# Patient Record
Sex: Male | Born: 1970
Health system: Southern US, Community
[De-identification: ages and names within clinical notes are randomized; demographics above are authoritative.]

## PROBLEM LIST (undated history)

## (undated) DIAGNOSIS — G4733 Obstructive sleep apnea (adult) (pediatric): Secondary | ICD-10-CM

## (undated) DIAGNOSIS — F32A Depression, unspecified: Secondary | ICD-10-CM

## (undated) DIAGNOSIS — I10 Essential (primary) hypertension: Secondary | ICD-10-CM

## (undated) DIAGNOSIS — Z9989 Dependence on other enabling machines and devices: Secondary | ICD-10-CM

## (undated) DIAGNOSIS — J45909 Unspecified asthma, uncomplicated: Secondary | ICD-10-CM

## (undated) DIAGNOSIS — M1990 Unspecified osteoarthritis, unspecified site: Secondary | ICD-10-CM

## (undated) DIAGNOSIS — E119 Type 2 diabetes mellitus without complications: Secondary | ICD-10-CM

## (undated) DIAGNOSIS — F419 Anxiety disorder, unspecified: Secondary | ICD-10-CM

## (undated) DIAGNOSIS — T7840XA Allergy, unspecified, initial encounter: Secondary | ICD-10-CM

## (undated) HISTORY — DX: Allergy, unspecified, initial encounter: T78.40XA

## (undated) HISTORY — DX: Essential (primary) hypertension: I10

## (undated) HISTORY — DX: Unspecified asthma, uncomplicated: J45.909

## (undated) HISTORY — PX: HERNIA REPAIR: SHX51

## (undated) HISTORY — DX: Type 2 diabetes mellitus without complications: E11.9

## (undated) HISTORY — PX: VASECTOMY: SHX75

---

## 1998-07-20 ENCOUNTER — Emergency Department (HOSPITAL_COMMUNITY): Admission: EM | Admit: 1998-07-20 | Discharge: 1998-07-20 | Payer: Self-pay | Admitting: Emergency Medicine

## 1998-07-26 ENCOUNTER — Encounter: Admission: RE | Admit: 1998-07-26 | Discharge: 1998-07-26 | Payer: Self-pay | Admitting: Hematology and Oncology

## 1998-08-02 ENCOUNTER — Encounter: Admission: RE | Admit: 1998-08-02 | Discharge: 1998-10-31 | Payer: Self-pay | Admitting: Internal Medicine

## 1998-09-14 ENCOUNTER — Encounter: Admission: RE | Admit: 1998-09-14 | Discharge: 1998-09-14 | Payer: Self-pay | Admitting: Internal Medicine

## 1998-09-25 ENCOUNTER — Encounter: Payer: Self-pay | Admitting: Hematology and Oncology

## 1998-09-25 ENCOUNTER — Encounter: Admission: RE | Admit: 1998-09-25 | Discharge: 1998-09-25 | Payer: Self-pay | Admitting: Hematology and Oncology

## 1998-09-25 ENCOUNTER — Ambulatory Visit (HOSPITAL_COMMUNITY): Admission: RE | Admit: 1998-09-25 | Discharge: 1998-09-25 | Payer: Self-pay | Admitting: Hematology and Oncology

## 1998-11-22 ENCOUNTER — Encounter: Admission: RE | Admit: 1998-11-22 | Discharge: 1999-02-20 | Payer: Self-pay | Admitting: Internal Medicine

## 1999-01-31 ENCOUNTER — Encounter: Admission: RE | Admit: 1999-01-31 | Discharge: 1999-01-31 | Payer: Self-pay | Admitting: Internal Medicine

## 1999-01-31 ENCOUNTER — Encounter: Payer: Self-pay | Admitting: Internal Medicine

## 1999-01-31 ENCOUNTER — Ambulatory Visit (HOSPITAL_COMMUNITY): Admission: RE | Admit: 1999-01-31 | Discharge: 1999-01-31 | Payer: Self-pay | Admitting: Internal Medicine

## 2001-12-23 ENCOUNTER — Emergency Department (HOSPITAL_COMMUNITY): Admission: EM | Admit: 2001-12-23 | Discharge: 2001-12-23 | Payer: Self-pay | Admitting: Emergency Medicine

## 2001-12-23 ENCOUNTER — Encounter: Payer: Self-pay | Admitting: Emergency Medicine

## 2003-08-16 ENCOUNTER — Emergency Department (HOSPITAL_COMMUNITY): Admission: EM | Admit: 2003-08-16 | Discharge: 2003-08-16 | Payer: Self-pay | Admitting: Emergency Medicine

## 2004-08-21 ENCOUNTER — Emergency Department (HOSPITAL_COMMUNITY): Admission: EM | Admit: 2004-08-21 | Discharge: 2004-08-21 | Payer: Self-pay | Admitting: Emergency Medicine

## 2011-02-12 ENCOUNTER — Ambulatory Visit (INDEPENDENT_AMBULATORY_CARE_PROVIDER_SITE_OTHER): Payer: 59 | Admitting: Family Medicine

## 2011-02-12 VITALS — BP 135/84 | HR 86 | Temp 98.6°F | Resp 16 | Ht 76.0 in | Wt 267.0 lb

## 2011-02-12 DIAGNOSIS — I10 Essential (primary) hypertension: Secondary | ICD-10-CM | POA: Insufficient documentation

## 2011-02-12 MED ORDER — LISINOPRIL-HYDROCHLOROTHIAZIDE 20-25 MG PO TABS: 1.0000 | ORAL_TABLET | Freq: Every day | ORAL | Status: DC

## 2011-02-12 NOTE — Progress Notes (Signed)
  Subjective:    Patient ID: George Patton, male    DOB: Jun 04, 1970, 41 y.o.   MRN: [id number]  HPI 41 yo male with history of htn here for med refills.  Takes lisinopril-hctz 20-25.  Last labs one year ago, normal.  Ran out yesterday.   Doing well on it.  No side effects.   No other complaints.    Review of Systems Negative except as per HPI     Objective:   Physical Exam  Constitutional: He appears well-developed and well-nourished.  Cardiovascular: Normal rate, regular rhythm, normal heart sounds and intact distal pulses.   Pulmonary/Chest: Effort normal and breath sounds normal.          Assessment & Plan:  HTN - refilled med 90days, 1 refill.  Check bmet. Plans to return for physical in 4-6 weeks.

## 2011-02-13 LAB — BASIC METABOLIC PANEL
BUN: 17 mg/dL (ref 6–23)
CO2: 29 mEq/L (ref 19–32)
Calcium: 9.8 mg/dL (ref 8.4–10.5)
Chloride: 103 mEq/L (ref 96–112)
Creat: 1.14 mg/dL (ref 0.50–1.35)
Glucose, Bld: 122 mg/dL — ABNORMAL HIGH (ref 70–99)
Potassium: 3.8 mEq/L (ref 3.5–5.3)
Sodium: 140 mEq/L (ref 135–145)

## 2011-08-13 ENCOUNTER — Other Ambulatory Visit: Payer: Self-pay | Admitting: Family Medicine

## 2011-08-14 ENCOUNTER — Other Ambulatory Visit: Payer: Self-pay | Admitting: Family Medicine

## 2011-11-12 ENCOUNTER — Other Ambulatory Visit: Payer: Self-pay | Admitting: Physician Assistant

## 2011-12-17 ENCOUNTER — Ambulatory Visit (INDEPENDENT_AMBULATORY_CARE_PROVIDER_SITE_OTHER): Payer: 59 | Admitting: Family Medicine

## 2011-12-17 VITALS — BP 144/96 | HR 90 | Temp 98.2°F | Resp 18 | Ht 77.0 in | Wt 370.8 lb

## 2011-12-17 DIAGNOSIS — Z125 Encounter for screening for malignant neoplasm of prostate: Secondary | ICD-10-CM

## 2011-12-17 DIAGNOSIS — E663 Overweight: Secondary | ICD-10-CM

## 2011-12-17 DIAGNOSIS — I10 Essential (primary) hypertension: Secondary | ICD-10-CM

## 2011-12-17 DIAGNOSIS — Z23 Encounter for immunization: Secondary | ICD-10-CM

## 2011-12-17 LAB — POCT CBC
Granulocyte percent: 64.9 %G (ref 37–80)
HCT, POC: 49.9 % (ref 43.5–53.7)
Hemoglobin: 15.5 g/dL (ref 14.1–18.1)
Lymph, poc: 2 (ref 0.6–3.4)
MCH, POC: 28.2 pg (ref 27–31.2)
MCHC: 31.1 g/dL — AB (ref 31.8–35.4)
MCV: 90.7 fL (ref 80–97)
MID (cbc): 0.7 (ref 0–0.9)
MPV: 11.5 fL (ref 0–99.8)
POC Granulocyte: 5 (ref 2–6.9)
POC LYMPH PERCENT: 26.2 %L (ref 10–50)
POC MID %: 8.9 %M (ref 0–12)
Platelet Count, POC: 254 10*3/uL (ref 142–424)
RBC: 5.5 M/uL (ref 4.69–6.13)
RDW, POC: 15 %
WBC: 7.7 10*3/uL (ref 4.6–10.2)

## 2011-12-17 LAB — COMPREHENSIVE METABOLIC PANEL
ALT: 20 U/L (ref 0–53)
AST: 16 U/L (ref 0–37)
Albumin: 4.5 g/dL (ref 3.5–5.2)
Alkaline Phosphatase: 75 U/L (ref 39–117)
BUN: 13 mg/dL (ref 6–23)
CO2: 27 mEq/L (ref 19–32)
Calcium: 9.5 mg/dL (ref 8.4–10.5)
Chloride: 102 mEq/L (ref 96–112)
Creat: 1.1 mg/dL (ref 0.50–1.35)
Glucose, Bld: 105 mg/dL — ABNORMAL HIGH (ref 70–99)
Potassium: 4.3 mEq/L (ref 3.5–5.3)
Sodium: 141 mEq/L (ref 135–145)
Total Bilirubin: 0.5 mg/dL (ref 0.3–1.2)
Total Protein: 7.1 g/dL (ref 6.0–8.3)

## 2011-12-17 LAB — PSA: PSA: 0.37 ng/mL (ref <=4.00)

## 2011-12-17 LAB — LIPID PANEL
Cholesterol: 165 mg/dL (ref 0–200)
HDL: 40 mg/dL (ref >39)
LDL Cholesterol: 108 mg/dL — ABNORMAL HIGH (ref 0–99)
Total CHOL/HDL Ratio: 4.1 Ratio
Triglycerides: 85 mg/dL (ref <150)
VLDL: 17 mg/dL (ref 0–40)

## 2011-12-17 MED ORDER — LISINOPRIL-HYDROCHLOROTHIAZIDE 20-25 MG PO TABS: 1.0000 | ORAL_TABLET | Freq: Every day | ORAL | Status: DC

## 2011-12-17 NOTE — Patient Instructions (Addendum)
Keep an eye on your BP and have a wonderful holiday.  I will be in touch with your labs and please come and see me for a physical when you can

## 2011-12-17 NOTE — Progress Notes (Signed)
[facility] [address], [place] [address] [phone]  Date:  12/17/2011   Name:  George Patton   DOB:  [birth year]   MRN:  [id number]  PCP:  Abbe Amsterdam, MD    Chief Complaint: Hypertension   History of Present Illness:  George Patton is a 41 y.o. very pleasant male patient who presents with the following:  Here to recheck meds and labs.  Last visit with [place] in February of this year.  He had a BMP at that time as well.  He has been taking his medication, but did not take it yet today.  He usually takes in in the morning.   When he self- checks his BP it will be 135/ 88 or thereabouts.    He is fating today and would like to do labs as well, he also needs his flu shot.   He has 3 children ages 39, 56 and 31.    Patient Active Problem List  Diagnosis  . Hypertension    Past Medical History  Diagnosis Date  . Allergy   . Asthma   . Hypertension     Past Surgical History  Procedure Date  . Vasectomy   . Hernia repair     History  Substance Use Topics  . Smoking status: Current Some Day Smoker -- 0.2 packs/day    Types: Cigarettes  . Smokeless tobacco: Not on file  . Alcohol Use: 0.6 oz/week    1 Shots of liquor per week    Family History  Problem Relation Age of Onset  . Asthma Mother   . Migraines Sister   . Asthma Son     No Known Allergies  Medication list has been reviewed and updated.  Current Outpatient Prescriptions on File Prior to Visit  Medication Sig Dispense Refill  . lisinopril-hydrochlorothiazide (PRINZIDE,ZESTORETIC) 20-25 MG per tablet Take 1 tablet by mouth daily. Need office visit for additional refills.  30 tablet  0    Review of Systems:  As per HPI- otherwise negative.   Physical Examination: Filed Vitals:   12/17/11 0850  BP: 144/96  Pulse: 90  Temp: 98.2 F (36.8 C)  Resp: 18   Filed Vitals:   12/17/11 0850  Height: 6\' 5"  (1.956 m)  Weight: 370 lb 12.8 oz (168.194 kg)   Body mass index  is 43.97 kg/(m^2). Ideal Body Weight: Weight in (lb) to have BMI = 25: 210.4   GEN: WDWN, NAD, Non-toxic, A & O x 3, obese and tall build HEENT: Atraumatic, Normocephalic. Neck supple. No masses, No LAD. Ears and Nose: No external deformity. CV: RRR, No M/G/R. No JVD. No thrill. No extra heart sounds. PULM: CTA B, no wheezes, crackles, rhonchi. No retractions. No resp. distress. No accessory muscle use. ABD: S, NT, ND, No rebound. No HSM. EXTR: No c/c/e NEURO Normal gait.  PSYCH: Normally interactive. Conversant. Not depressed or anxious appearing.  Calm demeanor.    Assessment and Plan: 1. HTN (hypertension)  lisinopril-hydrochlorothiazide (PRINZIDE,ZESTORETIC) 20-25 MG per tablet, POCT CBC, Comprehensive metabolic panel, Lipid panel  2. Overweight  Lipid panel  3. Screening for prostate cancer  PSA   BP is a bit up today but he did not take his medication this am.  Bastien would like to have a PSA today as we are drawing blood, no family history of prostate cancer See patient instructions for more details.     Abbe Amsterdam, MD

## 2011-12-18 ENCOUNTER — Encounter: Payer: Self-pay | Admitting: Family Medicine

## 2012-11-11 ENCOUNTER — Other Ambulatory Visit: Payer: Self-pay

## 2012-12-20 ENCOUNTER — Telehealth: Payer: Self-pay

## 2012-12-20 DIAGNOSIS — I10 Essential (primary) hypertension: Secondary | ICD-10-CM

## 2012-12-20 MED ORDER — LISINOPRIL-HYDROCHLOROTHIAZIDE 20-25 MG PO TABS: 1.0000 | ORAL_TABLET | Freq: Every day | ORAL | Status: DC

## 2012-12-20 NOTE — Telephone Encounter (Signed)
Sent in

## 2012-12-20 NOTE — Telephone Encounter (Signed)
Pt is scheduled to see Dr Patsy Lager for a CPE on 01/25/12 @ 9:00, but will be out of BP meds in 4days. Pt is requesting a refill, until he can see her in January Pt uses [facility] on [address]

## 2013-01-24 ENCOUNTER — Other Ambulatory Visit: Payer: Self-pay | Admitting: Family Medicine

## 2013-01-24 ENCOUNTER — Ambulatory Visit (INDEPENDENT_AMBULATORY_CARE_PROVIDER_SITE_OTHER): Payer: 59 | Admitting: Family Medicine

## 2013-01-24 ENCOUNTER — Encounter: Payer: Self-pay | Admitting: Family Medicine

## 2013-01-24 VITALS — BP 142/85 | HR 82 | Temp 98.5°F | Resp 16 | Ht 76.0 in | Wt 368.6 lb

## 2013-01-24 DIAGNOSIS — Z23 Encounter for immunization: Secondary | ICD-10-CM

## 2013-01-24 DIAGNOSIS — E669 Obesity, unspecified: Secondary | ICD-10-CM | POA: Insufficient documentation

## 2013-01-24 DIAGNOSIS — E119 Type 2 diabetes mellitus without complications: Secondary | ICD-10-CM

## 2013-01-24 DIAGNOSIS — I10 Essential (primary) hypertension: Secondary | ICD-10-CM

## 2013-01-24 DIAGNOSIS — Z0000 Encounter for general adult medical examination without abnormal findings: Secondary | ICD-10-CM

## 2013-01-24 DIAGNOSIS — G4733 Obstructive sleep apnea (adult) (pediatric): Secondary | ICD-10-CM | POA: Insufficient documentation

## 2013-01-24 DIAGNOSIS — R739 Hyperglycemia, unspecified: Secondary | ICD-10-CM

## 2013-01-24 LAB — CBC
HCT: 45.6 % (ref 39.0–52.0)
Hemoglobin: 15.8 g/dL (ref 13.0–17.0)
MCH: 28.6 pg (ref 26.0–34.0)
MCHC: 34.6 g/dL (ref 30.0–36.0)
MCV: 82.5 fL (ref 78.0–100.0)
Platelets: 266 10*3/uL (ref 150–400)
RBC: 5.53 MIL/uL (ref 4.22–5.81)
RDW: 14.3 % (ref 11.5–15.5)
WBC: 7.7 10*3/uL (ref 4.0–10.5)

## 2013-01-24 LAB — COMPREHENSIVE METABOLIC PANEL
ALT: 26 U/L (ref 0–53)
AST: 20 U/L (ref 0–37)
Albumin: 4.6 g/dL (ref 3.5–5.2)
Alkaline Phosphatase: 76 U/L (ref 39–117)
BUN: 15 mg/dL (ref 6–23)
CO2: 28 mEq/L (ref 19–32)
Calcium: 9.6 mg/dL (ref 8.4–10.5)
Chloride: 99 mEq/L (ref 96–112)
Creat: 1.07 mg/dL (ref 0.50–1.35)
Glucose, Bld: 124 mg/dL — ABNORMAL HIGH (ref 70–99)
Potassium: 3.8 mEq/L (ref 3.5–5.3)
Sodium: 138 mEq/L (ref 135–145)
Total Bilirubin: 0.5 mg/dL (ref 0.3–1.2)
Total Protein: 7.4 g/dL (ref 6.0–8.3)

## 2013-01-24 LAB — LIPID PANEL
Cholesterol: 170 mg/dL (ref 0–200)
HDL: 42 mg/dL (ref >39)
LDL Cholesterol: 108 mg/dL — ABNORMAL HIGH (ref 0–99)
Total CHOL/HDL Ratio: 4 Ratio
Triglycerides: 101 mg/dL (ref <150)
VLDL: 20 mg/dL (ref 0–40)

## 2013-01-24 LAB — PSA: PSA: 0.47 ng/mL (ref <=4.00)

## 2013-01-24 MED ORDER — LISINOPRIL-HYDROCHLOROTHIAZIDE 20-25 MG PO TABS
1.0000 | ORAL_TABLET | Freq: Every day | ORAL | Status: DC
Start: 2013-01-24 — End: 2014-03-13

## 2013-01-24 MED ORDER — LISINOPRIL 20 MG PO TABS: ORAL_TABLET | ORAL | Status: DC

## 2013-01-24 NOTE — Progress Notes (Addendum)
[facility] [address] [place] [address] [phone]  Date:  01/24/2013   Name:  George Patton   DOB:  May 16, 1970   MRN:  [id number]  PCP:  Lamar Blinks, MD    Chief Complaint: Annual Exam   History of Present Illness:  George Patton is a 43 y.o. very pleasant male patient who presents with the following:  He is here today for a CPE and follow-up HTN.  He is taking lisinopril/ hctz for his BP.   He is fasting today.  He does also have OSA and admits he does not like using his CPAP machine.  He has not used it "at all" for about 2 years. He would like to have a repeat evaluation   He is working in the [employer] system at [employer] now. He is enjoying this job.  However he does not get as much exercise as he would like because he is in the office a good bit.    He does notice some heartburn after eating  Patient Active Problem List   Diagnosis Date Noted  . Hypertension 02/12/2011    Past Medical History  Diagnosis Date  . Allergy   . Asthma   . Hypertension     Past Surgical History  Procedure Laterality Date  . Vasectomy    . Hernia repair      History  Substance Use Topics  . Smoking status: Current Some Day Smoker -- 0.25 packs/day    Types: Cigarettes  . Smokeless tobacco: Not on file  . Alcohol Use: 0.6 oz/week    1 Shots of liquor per week    Family History  Problem Relation Age of Onset  . Asthma Mother   . Migraines Sister   . Asthma Son     No Known Allergies  Medication list has been reviewed and updated.  Current Outpatient Prescriptions on File Prior to Visit  Medication Sig Dispense Refill  . lisinopril-hydrochlorothiazide (PRINZIDE,ZESTORETIC) 20-25 MG per tablet Take 1 tablet by mouth daily. Need office visit for additional refills.  30 tablet  0   No current facility-administered medications on file prior to visit.    Review of Systems:  As per HPI- otherwise negative. Wt Readings from Last 3 Encounters:   01/24/13 368 lb 9.6 oz (167.196 kg)  12/17/11 370 lb 12.8 oz (168.194 kg)  02/12/11 267 lb (121.11 kg)     Physical Examination: Filed Vitals:   01/24/13 0911  BP: 148/88  Pulse: 82  Temp: 98.5 F (36.9 C)  Resp: 16   Filed Vitals:   01/24/13 0911  Height: 6\' 4"  (1.93 m)  Weight: 368 lb 9.6 oz (167.196 kg)   Body mass index is 44.89 kg/(m^2). Ideal Body Weight: Weight in (lb) to have BMI = 25: 205  GEN: WDWN, NAD, Non-toxic, A & O x 3, looks well, obese HEENT: Atraumatic, Normocephalic. Neck supple. No masses, No LAD. Ears and Nose: No external deformity. CV: RRR, No M/G/R. No JVD. No thrill. No extra heart sounds. PULM: CTA B, no wheezes, crackles, rhonchi. No retractions. No resp. distress. No accessory muscle use. ABD: S, NT, ND. No rebound. No HSM. EXTR: No c/c/e NEURO Normal gait.  PSYCH: Normally interactive. Conversant. Not depressed or anxious appearing.  Calm demeanor.  GU: normal testicles and penis, no inguinal hernia Normal rectal exam, no prostate masses  Assessment and Plan: Physical exam - Plan: Comprehensive metabolic panel, Lipid panel, PSA, Flu vaccine greater than  or equal to 3yo preservative free IM, CBC  HTN (hypertension) - Plan: lisinopril-hydrochlorothiazide (PRINZIDE,ZESTORETIC) 20-25 MG per tablet, lisinopril (PRINIVIL,ZESTRIL) 20 MG tablet  OSA (obstructive sleep apnea) - Plan: Ambulatory referral to Neurology  Obesity, unspecified  HTN: we are going to add 10mg  of lisinopril at night to his current regimen.  He will give me a call with an update in the next few weeks.   Referral for a repeat sleep evaluation Encouraged weight loss and smoking cessation   Signed Lamar Blinks, MD  01/25/13: called with update. I am afraid that his labs do show DM with an A1c of 6.9%.  He is not really surprised to hear this news, but would like to try and lose weight so he will not need to be on medication permanently. For now will start metformin and  lovastatin.  Have asked him to make an appt to follow-up in 2 or 3 weeks so we can discuss this face to face.  He will look at the ADA website for more good info about diet and exercise.

## 2013-01-24 NOTE — Patient Instructions (Signed)
Please continue to work on quitting smoking.    Also, working on weight loss will help with your blood pressure.    Add the 2nd dose of lisinopril in the evening- start with a 1/2 of the 20 mg tablet for 10 mg.  Keep an eye on your blood pressure and send me an email in about one month with an update.

## 2013-01-25 LAB — HEMOGLOBIN A1C
Hgb A1c MFr Bld: 6.9 % — ABNORMAL HIGH (ref <5.7)
Mean Plasma Glucose: 151 mg/dL — ABNORMAL HIGH (ref <117)

## 2013-01-25 MED ORDER — METFORMIN HCL 500 MG PO TABS: ORAL_TABLET | ORAL | Status: DC

## 2013-01-25 MED ORDER — LOVASTATIN 20 MG PO TABS: 20.0000 mg | ORAL_TABLET | Freq: Every day | ORAL | Status: DC

## 2013-01-25 NOTE — Addendum Note (Signed)
Addended by: Lamar Blinks C on: 01/25/2013 08:54 AM   Modules accepted: Orders

## 2013-02-01 ENCOUNTER — Telehealth: Payer: Self-pay

## 2013-02-01 NOTE — Telephone Encounter (Signed)
George Patton from [facility] needs further information about this patient referral for cpap therapy does it need psg or split and needs the referral signed by our physician  Best number is [phone]

## 2013-02-14 ENCOUNTER — Encounter: Payer: Self-pay | Admitting: Family Medicine

## 2013-02-14 ENCOUNTER — Ambulatory Visit (INDEPENDENT_AMBULATORY_CARE_PROVIDER_SITE_OTHER): Payer: 59 | Admitting: Family Medicine

## 2013-02-14 VITALS — BP 140/90 | HR 74 | Temp 98.5°F | Resp 18 | Ht 76.0 in | Wt 368.8 lb

## 2013-02-14 DIAGNOSIS — E119 Type 2 diabetes mellitus without complications: Secondary | ICD-10-CM

## 2013-02-14 DIAGNOSIS — I10 Essential (primary) hypertension: Secondary | ICD-10-CM

## 2013-02-14 DIAGNOSIS — Z23 Encounter for immunization: Secondary | ICD-10-CM

## 2013-02-14 NOTE — Progress Notes (Signed)
[facility] [address], [place] [address] [phone]  Date:  02/14/2013   Name:  George Patton   DOB:  1970/12/18   MRN:  [id number]  PCP:  Lamar Blinks, MD    Chief Complaint: Follow-up   History of Present Illness:  George Patton is a 43 y.o. very pleasant male patient who presents with the following:  He was diagnosed with DM at his recent CPE on 1/19- his A1c was 6.9%. We have started metformin and he is taking 500 mg a day at this point.   We did add another 1/2 of lisinopril in the evening.  He has not been checking his BP at home much yet.    He is "working on" his diet.  He has made some good changes and is trying to exercise more.    He is not checking his glucose yet but would like to learn how.    Wt Readings from Last 3 Encounters:  02/14/13 368 lb 12.8 oz (167.287 kg)  01/24/13 368 lb 9.6 oz (167.196 kg)  12/17/11 370 lb 12.8 oz (168.194 kg)     Patient Active Problem List   Diagnosis Date Noted  . OSA (obstructive sleep apnea) 01/24/2013  . Obesity, unspecified 01/24/2013  . Hypertension 02/12/2011    Past Medical History  Diagnosis Date  . Allergy   . Asthma   . Hypertension     Past Surgical History  Procedure Laterality Date  . Vasectomy    . Hernia repair      History  Substance Use Topics  . Smoking status: Current Some Day Smoker -- 0.25 packs/day    Types: Cigarettes  . Smokeless tobacco: Not on file  . Alcohol Use: 0.6 oz/week    1 Shots of liquor per week    Family History  Problem Relation Age of Onset  . Asthma Mother   . Migraines Sister   . Asthma Son     No Known Allergies  Medication list has been reviewed and updated.  Current Outpatient Prescriptions on File Prior to Visit  Medication Sig Dispense Refill  . lisinopril (PRINIVIL,ZESTRIL) 20 MG tablet Start with a 1/2 tablet in the evening.  Adjust as directed  90 tablet  3  . lisinopril-hydrochlorothiazide (PRINZIDE,ZESTORETIC) 20-25 MG  per tablet Take 1 tablet by mouth daily. Need office visit for additional refills.  90 tablet  3  . lovastatin (MEVACOR) 20 MG tablet Take 1 tablet (20 mg total) by mouth at bedtime.  90 tablet  3  . metFORMIN (GLUCOPHAGE) 500 MG tablet Take one pill at bedtime.  Increase to twice a day as directed  180 tablet  3   No current facility-administered medications on file prior to visit.    Review of Systems:  As per HPI- otherwise negative.   Physical Examination: Filed Vitals:   02/14/13 0921  BP: 160/94  Pulse: 74  Temp: 98.5 F (36.9 C)  Resp: 18   Filed Vitals:   02/14/13 0921  Height: 6\' 4"  (1.93 m)  Weight: 368 lb 12.8 oz (167.287 kg)   Body mass index is 44.91 kg/(m^2). Ideal Body Weight: Weight in (lb) to have BMI = 25: 205  GEN: WDWN, NAD, Non-toxic, A & O x 3, obese, looks well HEENT: Atraumatic, Normocephalic. Neck supple. No masses, No LAD.   Ears and Nose: No external deformity. CV: RRR, No M/G/R. No JVD. No thrill. No extra heart sounds. PULM: CTA B, no wheezes,  crackles, rhonchi. No retractions. No resp. distress. No accessory muscle use. ABD: S, NT, ND, +BS. No rebound. No HSM. EXTR: No c/c/e NEURO Normal gait.  PSYCH: Normally interactive. Conversant. Not depressed or anxious appearing.  Calm demeanor.  Performed foot exam today.  Normal perfusion, sensation and monofilament testing  Pt shown how to check his glucose on our meter today Assessment and Plan: Type II or unspecified type diabetes mellitus without mention of complication, not stated as uncontrolled - Plan: Microalbumin, urine, Pneumococcal polysaccharide vaccine 23-valent greater than or equal to 2yo subcutaneous/IM, HM DIABETES FOOT EXAM  HTN (hypertension)  Discussed diabetes, gave rx for meter and supplies.  Encouraged him to test a couple of times a week if he would like, or if not feeling well Pneumovax vaccine, microalbuin testing BP is controlled, continue medication Will plan further  follow- up pending labs. Plan recheck in about 3- 4 months for his DM   Signed Lamar Blinks, MD

## 2013-02-14 NOTE — Patient Instructions (Signed)
As a diabetic, there are several things you can do to monitor your condition and maintain your health.  1. Check your feet daily for any skin breakdown 2. Exercise and keep track of your diet 3. Let us know before you run out of your medications 4. Get your annual flu shot, and ask if you need a pneumonia shot 5. Ask if you are up to date on your labs; you should have an A1c every 6 months, a urine protein test annually, and a cholesterol test annually.  Your doctor may decide to do labs more often if indicated 6. Take off your shoes and socks at each visit.  Be sure your doctor examines your feet.   7. Ask about your blood pressure.  Your goal is 130/ 80 or less 8. Get an annual eye exam.  Please ask your ophthalmologist to send [place] your report 9. Keep up with your dental cleanings and exams.

## 2013-02-15 LAB — MICROALBUMIN, URINE: Microalb, Ur: 0.67 mg/dL (ref 0.00–1.89)

## 2013-05-16 ENCOUNTER — Ambulatory Visit (INDEPENDENT_AMBULATORY_CARE_PROVIDER_SITE_OTHER): Payer: 59 | Admitting: Family Medicine

## 2013-05-16 ENCOUNTER — Encounter: Payer: Self-pay | Admitting: Family Medicine

## 2013-05-16 VITALS — BP 140/90 | HR 82 | Temp 98.0°F | Resp 16 | Ht 76.0 in | Wt 373.4 lb

## 2013-05-16 DIAGNOSIS — E785 Hyperlipidemia, unspecified: Secondary | ICD-10-CM

## 2013-05-16 DIAGNOSIS — E119 Type 2 diabetes mellitus without complications: Secondary | ICD-10-CM

## 2013-05-16 DIAGNOSIS — I10 Essential (primary) hypertension: Secondary | ICD-10-CM

## 2013-05-16 LAB — LIPID PANEL
Cholesterol: 173 mg/dL (ref 0–200)
HDL: 39 mg/dL — ABNORMAL LOW (ref >39)
LDL Cholesterol: 108 mg/dL — ABNORMAL HIGH (ref 0–99)
Total CHOL/HDL Ratio: 4.4 Ratio
Triglycerides: 131 mg/dL (ref <150)
VLDL: 26 mg/dL (ref 0–40)

## 2013-05-16 LAB — BASIC METABOLIC PANEL
BUN: 14 mg/dL (ref 6–23)
CO2: 27 mEq/L (ref 19–32)
Calcium: 9.3 mg/dL (ref 8.4–10.5)
Chloride: 103 mEq/L (ref 96–112)
Creat: 1.14 mg/dL (ref 0.50–1.35)
Glucose, Bld: 119 mg/dL — ABNORMAL HIGH (ref 70–99)
Potassium: 3.7 mEq/L (ref 3.5–5.3)
Sodium: 138 mEq/L (ref 135–145)

## 2013-05-16 LAB — HEMOGLOBIN A1C
Hgb A1c MFr Bld: 6.9 % — ABNORMAL HIGH (ref <5.7)
Mean Plasma Glucose: 151 mg/dL — ABNORMAL HIGH (ref <117)

## 2013-05-16 NOTE — Progress Notes (Signed)
[facility] [address], [place] [address] [phone]  Date:  05/16/2013   Name:  George Patton   DOB:  [birth year]   MRN:  [id number]  PCP:  Lamar Blinks, MD    Chief Complaint: Hypertension and Diabetes   History of Present Illness:  George Patton is a 43 y.o. very pleasant male patient who presents with the following:  Here today to recheck BP and also discuss recently dx diabetes.  He was dx with an A1c of 6.9 in January. He is taking lisinopril and metformin, as well as lovastatin.    Wt Readings from Last 3 Encounters:  05/16/13 373 lb 6.4 oz (169.373 kg)  02/14/13 368 lb 12.8 oz (167.287 kg)  01/24/13 368 lb 9.6 oz (167.196 kg)   He is doing well, he has not noted any SE.  He just got a glucose meter, and plan to start checking his glucose He is on metformin 500 once a day.    He also has not been checking his BP at home.   He is trying to exericse some, but "I know I ned to do more.  He works at [employer]  He is fasting today for labs  Patient Active Problem List   Diagnosis Date Noted  . OSA (obstructive sleep apnea) 01/24/2013  . Obesity, unspecified 01/24/2013  . Hypertension 02/12/2011    Past Medical History  Diagnosis Date  . Allergy   . Asthma   . Hypertension     Past Surgical History  Procedure Laterality Date  . Vasectomy    . Hernia repair      History  Substance Use Topics  . Smoking status: Current Some Day Smoker -- 0.25 packs/day    Types: Cigarettes  . Smokeless tobacco: Not on file  . Alcohol Use: 0.6 oz/week    1 Shots of liquor per week    Family History  Problem Relation Age of Onset  . Asthma Mother   . Migraines Sister   . Asthma Son     No Known Allergies  Medication list has been reviewed and updated.  Current Outpatient Prescriptions on File Prior to Visit  Medication Sig Dispense Refill  . lisinopril (PRINIVIL,ZESTRIL) 20 MG tablet Start with a 1/2 tablet in the evening.  Adjust  as directed  90 tablet  3  . lisinopril-hydrochlorothiazide (PRINZIDE,ZESTORETIC) 20-25 MG per tablet Take 1 tablet by mouth daily. Need office visit for additional refills.  90 tablet  3  . lovastatin (MEVACOR) 20 MG tablet Take 1 tablet (20 mg total) by mouth at bedtime.  90 tablet  3  . metFORMIN (GLUCOPHAGE) 500 MG tablet Take one pill at bedtime.  Increase to twice a day as directed  180 tablet  3   No current facility-administered medications on file prior to visit.    Review of Systems:  As per HPI- otherwise negative.   Physical Examination: Filed Vitals:   05/16/13 0930  BP: 150/86  Pulse: 82  Temp: 98 F (36.7 C)  Resp: 16   Filed Vitals:   05/16/13 0930  Height: 6\' 4"  (1.93 m)  Weight: 373 lb 6.4 oz (169.373 kg)   Body mass index is 45.47 kg/(m^2). Ideal Body Weight: Weight in (lb) to have BMI = 25: 205  GEN: WDWN, NAD, Non-toxic, A & O x 3, obese, looks well HEENT: Atraumatic, Normocephalic. Neck supple. No masses, No LAD. Ears and Nose: No external deformity. CV: RRR, No  M/G/R. No JVD. No thrill. No extra heart sounds. PULM: CTA B, no wheezes, crackles, rhonchi. No retractions. No resp. distress. No accessory muscle use. ABD: S, NT, ND. EXTR: No c/c/e NEURO Normal gait.  PSYCH: Normally interactive. Conversant. Not depressed or anxious appearing.  Calm demeanor.    Assessment and Plan: Type II or unspecified type diabetes mellitus without mention of complication, not stated as uncontrolled - Plan: HM Diabetes Foot Exam, Basic metabolic panel, Lipid panel, Hemoglobin A1c, CANCELED: POCT glycosylated hemoglobin (Hb A1C)  Dyslipidemia - Plan: Lipid panel  HTN (hypertension)  Morbid obesity  Check labs as above today.  unfortunately he has gained a few lbs.  Again discussed the importance of diet, exercise and weight loss.  Will plan to recheck in about 3 months.  Continue current regimen.  Encouraged him to get an eye exam asap Signed Lamar Blinks,  MD  Results for orders placed in visit on 42/70/62  BASIC METABOLIC PANEL      Result Value Ref Range   Sodium 138  135 - 145 mEq/L   Potassium 3.7  3.5 - 5.3 mEq/L   Chloride 103  96 - 112 mEq/L   CO2 27  19 - 32 mEq/L   Glucose, Bld 119 (*) 70 - 99 mg/dL   BUN 14  6 - 23 mg/dL   Creat 1.14  0.50 - 1.35 mg/dL   Calcium 9.3  8.4 - 10.5 mg/dL  LIPID PANEL      Result Value Ref Range   Cholesterol 173  0 - 200 mg/dL   Triglycerides 131  <150 mg/dL   HDL 39 (*) >39 mg/dL   Total CHOL/HDL Ratio 4.4     VLDL 26  0 - 40 mg/dL   LDL Cholesterol 108 (*) 0 - 99 mg/dL  HEMOGLOBIN A1C      Result Value Ref Range   Hemoglobin A1C 6.9 (*) <5.7 %   Mean Plasma Glucose 151 (*) <117 mg/dL

## 2013-05-16 NOTE — Patient Instructions (Signed)
Please do work on your diet and exercise- we need to get some pounds off.  I will be in touch with your a1c and other labs when they come in Always great to see you, take care and let's plan to recheck in August.

## 2013-08-15 ENCOUNTER — Ambulatory Visit: Payer: 59 | Admitting: Family Medicine

## 2013-08-29 ENCOUNTER — Encounter: Payer: Self-pay | Admitting: Family Medicine

## 2013-08-29 ENCOUNTER — Ambulatory Visit (INDEPENDENT_AMBULATORY_CARE_PROVIDER_SITE_OTHER): Payer: 59 | Admitting: Family Medicine

## 2013-08-29 VITALS — BP 157/95 | HR 84 | Temp 98.0°F | Resp 18 | Ht 77.0 in | Wt 369.0 lb

## 2013-08-29 DIAGNOSIS — E119 Type 2 diabetes mellitus without complications: Secondary | ICD-10-CM

## 2013-08-29 DIAGNOSIS — I10 Essential (primary) hypertension: Secondary | ICD-10-CM

## 2013-08-29 LAB — COMPREHENSIVE METABOLIC PANEL
ALT: 31 U/L (ref 0–53)
AST: 21 U/L (ref 0–37)
Albumin: 4.6 g/dL (ref 3.5–5.2)
Alkaline Phosphatase: 80 U/L (ref 39–117)
BUN: 15 mg/dL (ref 6–23)
CO2: 28 mEq/L (ref 19–32)
Calcium: 9.7 mg/dL (ref 8.4–10.5)
Chloride: 101 mEq/L (ref 96–112)
Creat: 1.14 mg/dL (ref 0.50–1.35)
Glucose, Bld: 122 mg/dL — ABNORMAL HIGH (ref 70–99)
Potassium: 4 mEq/L (ref 3.5–5.3)
Sodium: 137 mEq/L (ref 135–145)
Total Bilirubin: 0.6 mg/dL (ref 0.2–1.2)
Total Protein: 7.3 g/dL (ref 6.0–8.3)

## 2013-08-29 LAB — HEMOGLOBIN A1C
Hgb A1c MFr Bld: 7 % — ABNORMAL HIGH (ref <5.7)
Mean Plasma Glucose: 154 mg/dL — ABNORMAL HIGH (ref <117)

## 2013-08-29 NOTE — Progress Notes (Signed)
[facility] [address] [place] [address] [phone]  Date:  08/29/2013   Name:  ITZAEL LIPTAK   DOB:  [birth year]   MRN:  [id number]  PCP:  Lamar Blinks, MD    Chief Complaint: Follow-up   History of Present Illness:  IGNACE MANDIGO is a 43 y.o. very pleasant male patient who presents with the following:  Here today to follow-up on his DM.  He is doing well and has no particular concerns.   He is taking metformin 500 mg once a day and doing well with this.   He is taking lisinopril 20/25 in the am and another 10mg  of lisinopril only at night.   He is not really checking his BP.  His glucose is running approx 110 or less in the am.    He does have an eye doctor and is UTD.   He would like to do a flu shot today  He stopped smoking about 2 months now.  This is going well - he was never a heavy smoker.  "I don't miss it at all."    Wt Readings from Last 3 Encounters:  08/29/13 369 lb (167.377 kg)  05/16/13 373 lb 6.4 oz (169.373 kg)  02/14/13 368 lb 12.8 oz (167.287 kg)    Lab Results  Component Value Date   HGBA1C 6.9* 05/16/2013     Patient Active Problem List   Diagnosis Date Noted  . Type II or unspecified type diabetes mellitus without mention of complication, not stated as uncontrolled 05/16/2013  . OSA (obstructive sleep apnea) 01/24/2013  . Obesity, unspecified 01/24/2013  . Hypertension 02/12/2011    Past Medical History  Diagnosis Date  . Allergy   . Asthma   . Hypertension     Past Surgical History  Procedure Laterality Date  . Vasectomy    . Hernia repair      History  Substance Use Topics  . Smoking status: Current Some Day Smoker -- 0.25 packs/day    Types: Cigarettes  . Smokeless tobacco: Not on file  . Alcohol Use: 0.6 oz/week    1 Shots of liquor per week    Family History  Problem Relation Age of Onset  . Asthma Mother   . Migraines Sister   . Asthma Son     No Known Allergies  Medication list has  been reviewed and updated.  Current Outpatient Prescriptions on File Prior to Visit  Medication Sig Dispense Refill  . lisinopril (PRINIVIL,ZESTRIL) 20 MG tablet Start with a 1/2 tablet in the evening.  Adjust as directed  90 tablet  3  . lisinopril-hydrochlorothiazide (PRINZIDE,ZESTORETIC) 20-25 MG per tablet Take 1 tablet by mouth daily. Need office visit for additional refills.  90 tablet  3  . lovastatin (MEVACOR) 20 MG tablet Take 1 tablet (20 mg total) by mouth at bedtime.  90 tablet  3  . metFORMIN (GLUCOPHAGE) 500 MG tablet Take one pill at bedtime.  Increase to twice a day as directed  180 tablet  3   No current facility-administered medications on file prior to visit.    Review of Systems:  As per HPI- otherwise negative.   Physical Examination: Filed Vitals:   08/29/13 1002  BP: 152/91  Pulse: 84  Temp: 98 F (36.7 C)  Resp: 18   Filed Vitals:   08/29/13 1002  Height: 6\' 5"  (1.956 m)  Weight: 369 lb (167.377 kg)   Body mass index is 43.75 kg/(m^2). Ideal  Body Weight: Weight in (lb) to have BMI = 25: 210.4  GEN: WDWN, NAD, Non-toxic, A & O x 3, obese, looks well HEENT: Atraumatic, Normocephalic. Neck supple. No masses, No LAD.  Bilateral TM wnl, oropharynx normal.  PEERL,EOMI.   Ears and Nose: No external deformity. CV: RRR, No M/G/R. No JVD. No thrill. No extra heart sounds. PULM: CTA B, no wheezes, crackles, rhonchi. No retractions. No resp. distress. No accessory muscle use. EXTR: No c/c/e NEURO Normal gait.  PSYCH: Normally interactive. Conversant. Not depressed or anxious appearing.  Calm demeanor.    Assessment and Plan: Type II or unspecified type diabetes mellitus without mention of complication, not stated as uncontrolled - Plan: HM Diabetes Foot Exam, Hemoglobin A1c, Comprehensive metabolic panel, Flu Vaccine QUAD 36+ mos IM  Essential hypertension  He has generally had good DM control- will get in touch with his A1c result.   His BP is slightly  high.  He is going to check this at home and follow-up with me.  Plan recheck in 3-4 months  Signed Lamar Blinks, MD

## 2013-08-29 NOTE — Patient Instructions (Signed)
Great to see you today as always. Please do check your BP a few times and send me a message with your readings.   I will be in touch with your labs asap

## 2013-10-13 ENCOUNTER — Telehealth: Payer: Self-pay | Admitting: *Deleted

## 2013-10-13 NOTE — Telephone Encounter (Signed)
Called left message in voice mail to schedule an appointment to follow up on diabetes with Dr Lorelei Pont.

## 2014-03-13 ENCOUNTER — Ambulatory Visit (INDEPENDENT_AMBULATORY_CARE_PROVIDER_SITE_OTHER): Payer: 59 | Admitting: Family Medicine

## 2014-03-13 ENCOUNTER — Encounter: Payer: Self-pay | Admitting: Family Medicine

## 2014-03-13 VITALS — BP 193/121 | HR 95 | Temp 98.4°F | Resp 16 | Ht 76.0 in | Wt 376.0 lb

## 2014-03-13 DIAGNOSIS — I10 Essential (primary) hypertension: Secondary | ICD-10-CM | POA: Diagnosis not present

## 2014-03-13 DIAGNOSIS — Z634 Disappearance and death of family member: Secondary | ICD-10-CM

## 2014-03-13 DIAGNOSIS — E119 Type 2 diabetes mellitus without complications: Secondary | ICD-10-CM | POA: Diagnosis not present

## 2014-03-13 MED ORDER — LISINOPRIL 20 MG PO TABS: ORAL_TABLET | ORAL | Status: DC

## 2014-03-13 MED ORDER — LISINOPRIL-HYDROCHLOROTHIAZIDE 20-25 MG PO TABS: 1.0000 | ORAL_TABLET | Freq: Every day | ORAL | Status: DC

## 2014-03-13 MED ORDER — METFORMIN HCL 500 MG PO TABS: ORAL_TABLET | ORAL | Status: DC

## 2014-03-13 NOTE — Patient Instructions (Signed)
Please start back on your BP medication TODAY and let me know how your BP looks over the next couple of days I will look forward to seeing you at your physical next month.

## 2014-03-13 NOTE — Progress Notes (Signed)
[facility] [address], [place] [address] [phone]  Date:  03/13/2014   Name:  George Patton   DOB:  03-17-1970   MRN:  [id number]  PCP:  Lamar Blinks, MD    Chief Complaint: Hypertension; Medication Refill; and Diabetes   History of Present Illness:  George Patton is a 44 y.o. very pleasant male patient who presents with the following:  Here today for medication monitoring and refill.   His BP has generally been under reasonable control, as has his A1c He ran out of his BP medication about 4 days ago.   He is not fasting today He is taking metformin just once a day- would prefer to wait until his CPE next month  He has not had any CP or SOB with his HTN.  He has had some headaches since he ran out of his medication He is not taking his mevacor as it gave him SE.   encouraged him to buy his own cuff and he will think about doing so.    His father passed away unexpectedly a couple of weeks ago; this is why he is feeling depressed and down today.  His mother is doing ok but this is very hard for the whole family.  However he feels that he is having a normal grief reaction at this time  BP Readings from Last 3 Encounters:  03/13/14 193/121  08/29/13 157/95  05/16/13 140/90   Lab Results  Component Value Date   HGBA1C 7.0* 08/29/2013   Wt Readings from Last 3 Encounters:  03/13/14 376 lb (170.552 kg)  08/29/13 369 lb (167.377 kg)  05/16/13 373 lb 6.4 oz (169.373 kg)     Patient Active Problem List   Diagnosis Date Noted  . Type II or unspecified type diabetes mellitus without mention of complication, not stated as uncontrolled 05/16/2013  . OSA (obstructive sleep apnea) 01/24/2013  . Obesity, unspecified 01/24/2013  . Hypertension 02/12/2011    Past Medical History  Diagnosis Date  . Allergy   . Asthma   . Hypertension     Past Surgical History  Procedure Laterality Date  . Vasectomy    . Hernia repair      History  Substance  Use Topics  . Smoking status: Former Smoker -- 0.25 packs/day    Types: Cigarettes  . Smokeless tobacco: Not on file  . Alcohol Use: 0.6 oz/week    1 Shots of liquor per week    Family History  Problem Relation Age of Onset  . Asthma Mother   . Migraines Sister   . Asthma Son     No Known Allergies  Medication list has been reviewed and updated.  Current Outpatient Prescriptions on File Prior to Visit  Medication Sig Dispense Refill  . lisinopril (PRINIVIL,ZESTRIL) 20 MG tablet Start with a 1/2 tablet in the evening.  Adjust as directed 90 tablet 3  . lisinopril-hydrochlorothiazide (PRINZIDE,ZESTORETIC) 20-25 MG per tablet Take 1 tablet by mouth daily. Need office visit for additional refills. 90 tablet 3  . metFORMIN (GLUCOPHAGE) 500 MG tablet Take one pill at bedtime.  Increase to twice a day as directed 180 tablet 3  . lovastatin (MEVACOR) 20 MG tablet Take 1 tablet (20 mg total) by mouth at bedtime. (Patient not taking: Reported on 03/13/2014) 90 tablet 3   No current facility-administered medications on file prior to visit.    Review of Systems:  As per HPI- otherwise negative.   Physical  Examination: Filed Vitals:   03/13/14 1003  BP: 193/121  Pulse: 95  Temp: 98.4 F (36.9 C)  Resp: 16   Filed Vitals:   03/13/14 1003  Height: 6\' 4"  (1.93 m)  Weight: 376 lb (170.552 kg)   Body mass index is 45.79 kg/(m^2). Ideal Body Weight: Weight in (lb) to have BMI = 25: 205  GEN: WDWN, NAD, Non-toxic, A & O x 3, obese, tall and large build HEENT: Atraumatic, Normocephalic. Neck supple. No masses, No LAD. Ears and Nose: No external deformity. CV: RRR, No M/G/R. No JVD. No thrill. No extra heart sounds. PULM: CTA B, no wheezes, crackles, rhonchi. No retractions. No resp. distress. No accessory muscle use. EXTR: No c/c/e NEURO Normal gait.  PSYCH: Normally interactive. Conversant. Not depressed or anxious appearing.  Calm demeanor.    Assessment and Plan: Essential  hypertension - Plan: lisinopril-hydrochlorothiazide (PRINZIDE,ZESTORETIC) 20-25 MG per tablet, lisinopril (PRINIVIL,ZESTRIL) 20 MG tablet  Diabetes mellitus type 2, controlled - Plan: metFORMIN (GLUCOPHAGE) 500 MG tablet  Bereavement  Uncontrolled HTN.  Educated him that his BP is quite high- we need to always make sure that he has his medications.  He will start back on his medications now.   Refilled his medication for DM He is having a normal grief reaction- will discuss again when I see him next month He would like to defer labs until next month which is ok   Signed Lamar Blinks, MD

## 2014-04-24 ENCOUNTER — Ambulatory Visit (INDEPENDENT_AMBULATORY_CARE_PROVIDER_SITE_OTHER): Payer: 59 | Admitting: Family Medicine

## 2014-04-24 ENCOUNTER — Encounter: Payer: Self-pay | Admitting: Family Medicine

## 2014-04-24 VITALS — BP 148/100 | HR 88 | Temp 98.1°F | Resp 16 | Ht 77.0 in | Wt 374.2 lb

## 2014-04-24 DIAGNOSIS — Z0000 Encounter for general adult medical examination without abnormal findings: Secondary | ICD-10-CM

## 2014-04-24 DIAGNOSIS — Z125 Encounter for screening for malignant neoplasm of prostate: Secondary | ICD-10-CM

## 2014-04-24 DIAGNOSIS — E669 Obesity, unspecified: Secondary | ICD-10-CM | POA: Diagnosis not present

## 2014-04-24 DIAGNOSIS — I10 Essential (primary) hypertension: Secondary | ICD-10-CM

## 2014-04-24 DIAGNOSIS — J302 Other seasonal allergic rhinitis: Secondary | ICD-10-CM

## 2014-04-24 DIAGNOSIS — E119 Type 2 diabetes mellitus without complications: Secondary | ICD-10-CM | POA: Diagnosis not present

## 2014-04-24 DIAGNOSIS — E1169 Type 2 diabetes mellitus with other specified complication: Secondary | ICD-10-CM

## 2014-04-24 DIAGNOSIS — E785 Hyperlipidemia, unspecified: Secondary | ICD-10-CM | POA: Diagnosis not present

## 2014-04-24 DIAGNOSIS — Z130 Encounter for screening for diseases of the blood and blood-forming organs and certain disorders involving the immune mechanism: Secondary | ICD-10-CM

## 2014-04-24 DIAGNOSIS — Z119 Encounter for screening for infectious and parasitic diseases, unspecified: Secondary | ICD-10-CM | POA: Diagnosis not present

## 2014-04-24 LAB — CBC
HCT: 45 % (ref 39.0–52.0)
Hemoglobin: 14.9 g/dL (ref 13.0–17.0)
MCH: 28 pg (ref 26.0–34.0)
MCHC: 33.1 g/dL (ref 30.0–36.0)
MCV: 84.6 fL (ref 78.0–100.0)
MPV: 11.4 fL (ref 8.6–12.4)
Platelets: 252 10*3/uL (ref 150–400)
RBC: 5.32 MIL/uL (ref 4.22–5.81)
RDW: 14.1 % (ref 11.5–15.5)
WBC: 7.7 10*3/uL (ref 4.0–10.5)

## 2014-04-24 LAB — HIV ANTIBODY (ROUTINE TESTING W REFLEX): HIV 1&2 Ab, 4th Generation: NONREACTIVE

## 2014-04-24 LAB — COMPREHENSIVE METABOLIC PANEL
ALT: 43 U/L (ref 0–53)
AST: 25 U/L (ref 0–37)
Albumin: 4.4 g/dL (ref 3.5–5.2)
Alkaline Phosphatase: 77 U/L (ref 39–117)
BUN: 15 mg/dL (ref 6–23)
CO2: 25 mEq/L (ref 19–32)
Calcium: 9.4 mg/dL (ref 8.4–10.5)
Chloride: 102 mEq/L (ref 96–112)
Creat: 1.19 mg/dL (ref 0.50–1.35)
Glucose, Bld: 125 mg/dL — ABNORMAL HIGH (ref 70–99)
Potassium: 3.8 mEq/L (ref 3.5–5.3)
Sodium: 139 mEq/L (ref 135–145)
Total Bilirubin: 0.5 mg/dL (ref 0.2–1.2)
Total Protein: 7.3 g/dL (ref 6.0–8.3)

## 2014-04-24 LAB — MICROALBUMIN, URINE: Microalb, Ur: 5.3 mg/dL — ABNORMAL HIGH (ref <2.0)

## 2014-04-24 LAB — LIPID PANEL
Cholesterol: 182 mg/dL (ref 0–200)
HDL: 43 mg/dL (ref >=40)
LDL Cholesterol: 109 mg/dL — ABNORMAL HIGH (ref 0–99)
Total CHOL/HDL Ratio: 4.2 Ratio
Triglycerides: 148 mg/dL (ref <150)
VLDL: 30 mg/dL (ref 0–40)

## 2014-04-24 LAB — HEMOGLOBIN A1C
Hgb A1c MFr Bld: 7.5 % — ABNORMAL HIGH (ref <5.7)
Mean Plasma Glucose: 169 mg/dL — ABNORMAL HIGH (ref <117)

## 2014-04-24 MED ORDER — ALBUTEROL SULFATE HFA 108 (90 BASE) MCG/ACT IN AERS: 2.0000 | INHALATION_SPRAY | Freq: Four times a day (QID) | RESPIRATORY_TRACT | Status: AC | PRN

## 2014-04-24 NOTE — Progress Notes (Signed)
[facility] [address], [place] [address] [phone]  Date:  04/24/2014   Name:  George Patton   DOB:  31-Mar-1970   MRN:  [id number]  PCP:  Lamar Blinks, MD    Chief Complaint: Annual Exam   History of Present Illness:  George Patton is a 44 y.o. very pleasant male patient who presents with the following:  Here today for CPE and to follow-up obesity, DM, OSA, HTN.  I saw him last month when he had run out of his HTN medication for a few days.  We started him back on his BP medication. He was feeling pretty down at that time, but his father had just recently passed away.  He is still feeling this loss acutely, but think that he is having normal grief Immunizations are UTD. Eye exam is due, as is urine microalbumin Last labs in August. Last lipids in May, last PSA 1/15.    Right now for BP he is taking lisinopril20/hctz25 and an additional lisinopril 10 in the evening.   He is fasting for labs today.   He has not been taking his cholesterol medication as it caused him some SE- he has been off this for several months.   He is having season allergy sx.  He uses zyrtec- this does help, but he forgets to take it some of the time.   He has noted some recent wheezing- he had used advair seasonally in the past for this.  Zyrtec does help with this- it is worse when the pollen is heavy.  No inhalers now.   BP Readings from Last 3 Encounters:  04/24/14 148/100  03/13/14 193/121  08/29/13 157/95     Wt Readings from Last 3 Encounters:  04/24/14 374 lb 3.2 oz (169.736 kg)  03/13/14 376 lb (170.552 kg)  08/29/13 369 lb (167.377 kg)   Lab Results  Component Value Date   HGBA1C 7.0* 08/29/2013     Patient Active Problem List   Diagnosis Date Noted  . Type II or unspecified type diabetes mellitus without mention of complication, not stated as uncontrolled 05/16/2013  . OSA (obstructive sleep apnea) 01/24/2013  . Obesity, unspecified 01/24/2013  .  Hypertension 02/12/2011    Past Medical History  Diagnosis Date  . Allergy   . Asthma   . Hypertension     Past Surgical History  Procedure Laterality Date  . Vasectomy    . Hernia repair      History  Substance Use Topics  . Smoking status: Former Smoker -- 0.25 packs/day    Types: Cigarettes  . Smokeless tobacco: Not on file  . Alcohol Use: 0.6 oz/week    1 Shots of liquor per week    Family History  Problem Relation Age of Onset  . Asthma Mother   . Migraines Sister   . Asthma Son     No Known Allergies  Medication list has been reviewed and updated.  Current Outpatient Prescriptions on File Prior to Visit  Medication Sig Dispense Refill  . lisinopril (PRINIVIL,ZESTRIL) 20 MG tablet Start with a 1/2 tablet in the evening.  Adjust as directed 90 tablet 3  . lisinopril-hydrochlorothiazide (PRINZIDE,ZESTORETIC) 20-25 MG per tablet Take 1 tablet by mouth daily. Need office visit for additional refills. 90 tablet 3  . metFORMIN (GLUCOPHAGE) 500 MG tablet Take one pill at bedtime.  Increase to twice a day as directed 180 tablet 3  . lovastatin (MEVACOR) 20 MG tablet Take  1 tablet (20 mg total) by mouth at bedtime. (Patient not taking: Reported on 03/13/2014) 90 tablet 3   No current facility-administered medications on file prior to visit.    Review of Systems:  As per HPI- otherwise negative.   Physical Examination: Filed Vitals:   04/24/14 0852  BP: 148/100  Pulse: 88  Temp: 98.1 F (36.7 C)  Resp: 16   Filed Vitals:   04/24/14 0852  Height: 6\' 5"  (1.956 m)  Weight: 374 lb 3.2 oz (169.736 kg)   Body mass index is 44.36 kg/(m^2). Ideal Body Weight: Weight in (lb) to have BMI = 25: 210.4  GEN: WDWN, NAD, Non-toxic, A & O x 3, obese, looks well HEENT: Atraumatic, Normocephalic. Neck supple. No masses, No LAD.  Bilateral TM wnl, oropharynx normal.  PEERL,EOMI.   Ears and Nose: No external deformity. CV: RRR, No M/G/R. No JVD. No thrill. No extra heart  sounds. PULM: CTA B, no wheezes, crackles, rhonchi. No retractions. No resp. distress. No accessory muscle use. ABD: S, NT, ND, +BS. No rebound. No HSM. EXTR: No c/c/e NEURO Normal gait.  PSYCH: Normally interactive. Conversant. Not depressed or anxious appearing.  Calm demeanor.  Gu: normal prostate, DRE, testes and penis   Assessment and Plan: Physical exam  Diabetes mellitus type 2 in obese - Plan: Comprehensive metabolic panel, Lipid panel, Microalbumin, urine, Hemoglobin A1c  Morbid obesity - Plan: Comprehensive metabolic panel, Lipid panel, PSA, Ambulatory referral to Sleep Studies  Screening for prostate cancer  Hyperlipidemia - Plan: Lipid panel  Essential hypertension - Plan: Comprehensive metabolic panel  Screening examination for infectious disease - Plan: HIV antibody  Screening for deficiency anemia - Plan: CBC  Seasonal allergies - Plan: albuterol (PROVENTIL HFA;VENTOLIN HFA) 108 (90 BASE) MCG/ACT inhaler   Physical exam today.  He is feeling down- this is normal given recent loss of his father but asked him to let me know if not improving over the next month or so Encouraged daily zyrtec for his allergies and rx albuterol for occasional wheezing  He does have a history of sleep apnea but is not currently treated- he has been off his treatment for some time.  Will refer back to sleep medicine. He is also interested in weight loss surgery and will look at the bariatric seminar   Signed Lamar Blinks, MD

## 2014-04-24 NOTE — Patient Instructions (Addendum)
Great to see you today!  I will be in touch with your labs  As a diabetic, there are several things you can do to monitor your condition and maintain your health.  1. Check your feet daily for any skin breakdown 2. Exercise and keep track of your diet 3. Let us know before you run out of your medications 4. Get your annual flu shot, and ask if you need a pneumonia shot 5. Ask if you are up to date on your labs; you should have an A1c every 6 months, a urine protein test annually, and a cholesterol test annually.  Your doctor may decide to do labs more often if indicated 6. Take off your shoes and socks at each visit.  Be sure your doctor examines your feet.   7. Ask about your blood pressure.  Your goal is 130/ 80 or less 8. Get an annual eye exam.  Please ask your ophthalmologist to send [place] your report 9. Keep up with your dental cleanings and exams.    Increase your lisinpril in the evening to 20mg  to bring your BP down.    Try to take your zyrtec every day, and use the albuterol inhaler as needed for wheezing.    Assuming all looks good let's plan to recheck in 4-6 months.    You might want to consider weight loss surgery.   Losing a significant amount of weight will likely cure your diabetes and high blood pressure.   If you are interested sign up for the bariatric seminar through [facility]/ [place] bariatric program

## 2014-04-25 LAB — PSA: PSA: 0.44 ng/mL (ref <=4.00)

## 2014-06-14 ENCOUNTER — Ambulatory Visit (INDEPENDENT_AMBULATORY_CARE_PROVIDER_SITE_OTHER): Payer: 59 | Admitting: Neurology

## 2014-06-14 ENCOUNTER — Encounter: Payer: Self-pay | Admitting: Neurology

## 2014-06-14 VITALS — BP 190/108 | HR 78 | Resp 18 | Ht 77.0 in | Wt 370.0 lb

## 2014-06-14 DIAGNOSIS — I10 Essential (primary) hypertension: Secondary | ICD-10-CM

## 2014-06-14 DIAGNOSIS — G4733 Obstructive sleep apnea (adult) (pediatric): Secondary | ICD-10-CM | POA: Diagnosis not present

## 2014-06-14 DIAGNOSIS — R51 Headache: Secondary | ICD-10-CM | POA: Diagnosis not present

## 2014-06-14 DIAGNOSIS — R519 Headache, unspecified: Secondary | ICD-10-CM

## 2014-06-14 NOTE — Progress Notes (Signed)
Subjective:    Patient ID: George Patton is a 44 y.o. male.  HPI     Star Age, MD, PhD [facility] [address], [address] [place], [place] [address]  Dear Dr. Lorelei Pont,   I saw your patient, George Patton, upon your kind request in my neurologic clinic today for initial consultation of his sleep disorder, in particular, concern for underlying obstructive sleep apnea. The patient is unaccompanied today. As you know, George Patton is a 44 year old right-handed gentleman with an underlying medical history of diabetes, hypertension, morbid obesity and a prior diagnosis of OSA, who reports snoring, excessive daytime somnolence, nonrestorative sleep. He has had elevated blood pressure values. He reports that he missed his blood pressure medications yesterday. His initial blood pressure this morning was 200/110.   He has not been on CPAP treatment in over 5 years, as I understand. He no longer has a CPAP machine. He does not recall what pressure he was on. As he remembers he had moderate obstructive sleep apnea. He has gained weight since the original diagnosis which was about 10 years ago. His bedtime is between 11 PM and midnight typically. He does not have trouble falling asleep. His rise time is between 545 and 6 AM. He does not always wake up rested. He does feel tired during the day. His Epworth sleepiness score is 9 out of 24 today. Fatigue score is 34 out of 63. He works as a Education officer, environmental. He quit smoking a year ago. He drinks alcohol approximately once per week. He drinks coffee about 2 cups per day and about 2 glasses of sweet tea per day. He has not always been compliant with his diabetes and high blood pressure medications. His A1c has been slowly creeping up as well. His last A1c was 7.5 2 months ago. He has occasional morning headaches. He has occasional nocturia. His blood pressure values have been high in the past few months.  He does not recall a family  history of obstructive sleep apnea.  He denies numbness or tingling in his feet or restless leg symptoms. He does have some left hip pain.  His Past Medical History Is Significant For: Past Medical History  Diagnosis Date  . Allergy   . Asthma   . Hypertension   . Diabetes mellitus without complication     His Past Surgical History Is Significant For: Past Surgical History  Procedure Laterality Date  . Vasectomy    . Hernia repair      His Family History Is Significant For: Family History  Problem Relation Age of Onset  . Asthma Mother   . Migraines Sister   . Asthma Son   . Cancer Father     His Social History Is Significant For: History   Social History  . Marital Status: Married    Spouse Name: N/A  . Number of Children: 3  . Years of Education: BS   Social History Main Topics  . Smoking status: Former Smoker -- 0.25 packs/day    Types: Cigarettes    Quit date: 08/06/2013  . Smokeless tobacco: Not on file  . Alcohol Use: 0.6 oz/week    1 Shots of liquor per week     Comment: Once a month   . Drug Use: No  . Sexual Activity: Yes   Other Topics Concern  . None   Social History Narrative   Consumes about 2 cups of coffee a day     His  Allergies Are:  No Known Allergies:   His Current Medications Are:  Outpatient Encounter Prescriptions as of 06/14/2014  Medication Sig  . albuterol (PROVENTIL HFA;VENTOLIN HFA) 108 (90 BASE) MCG/ACT inhaler Inhale 2 puffs into the lungs every 6 (six) hours as needed for wheezing or shortness of breath.  . lisinopril (PRINIVIL,ZESTRIL) 20 MG tablet Start with a 1/2 tablet in the evening.  Adjust as directed (Patient taking differently: Take 20 mg by mouth daily. )  . lisinopril-hydrochlorothiazide (PRINZIDE,ZESTORETIC) 20-25 MG per tablet Take 1 tablet by mouth daily. Need office visit for additional refills.  . metFORMIN (GLUCOPHAGE) 500 MG tablet Take one pill at bedtime.  Increase to twice a day as directed  .  [DISCONTINUED] lovastatin (MEVACOR) 20 MG tablet Take 1 tablet (20 mg total) by mouth at bedtime. (Patient not taking: Reported on 03/13/2014)   No facility-administered encounter medications on file as of 06/14/2014.  :  Review of Systems:  Out of a complete 14 point review of systems, all are reviewed and negative with the exception of these symptoms as listed below:   Review of Systems  Neurological:       H/O Sleep Apnea with CPAP use, Snoring, trouble staying asleep, witnessed apnea, wakes up feeling tired in the morning, daytime tiredness, denies taking naps during day.     Objective:  Neurologic Exam  Physical Exam Physical Examination:   Filed Vitals:   06/14/14 0958  BP: 200/110  Pulse: 78  Resp: 18   Repeat blood pressure was 190/108.   General Examination: The patient is a very pleasant 44 y.o. male in no acute distress. He appears well-developed and well-nourished and well groomed. He is obese.   HEENT: Normocephalic, atraumatic, pupils are equal, round and reactive to light and accommodation. Funduscopic exam is normal with sharp disc margins noted. Extraocular tracking is good without limitation to gaze excursion or nystagmus noted. Normal smooth pursuit is noted. Hearing is grossly intact. Tympanic membranes are clear bilaterally. Face is symmetric with normal facial animation and normal facial sensation. Speech is clear with no dysarthria noted. There is no hypophonia. There is no lip, neck/head, jaw or voice tremor. Neck is supple with full range of passive and active motion. There are no carotid bruits on auscultation. Oropharynx exam reveals: mild mouth dryness, good dental hygiene and severe airway crowding, due to large tonsils, large tongue, large uvula, thick soft palate. Mallampati is class III. Tongue protrudes centrally and palate elevates symmetrically. Tonsils are 3+ in size. Neck size is 19 5/8 inches. He has a Mild overbite. Nasal inspection reveals no  significant nasal mucosal bogginess or redness and no septal deviation.   Chest: Clear to auscultation without wheezing, rhonchi or crackles noted.  Heart: S1+S2+0, regular and normal without murmurs, rubs or gallops noted.   Abdomen: Soft, non-tender and non-distended with normal bowel sounds appreciated on auscultation.  Extremities: There is no pitting edema in the distal lower extremities bilaterally. Pedal pulses are intact.  Skin: Warm and dry without trophic changes noted. There are no varicose veins.  Musculoskeletal: exam reveals no obvious joint deformities, tenderness or joint swelling or erythema.   Neurologically:  Mental status: The patient is awake, alert and oriented in all 4 spheres. His immediate and remote memory, attention, language skills and fund of knowledge are appropriate. There is no evidence of aphasia, agnosia, apraxia or anomia. Speech is clear with normal prosody and enunciation. Thought process is linear. Mood is normal and affect is normal.  Cranial  nerves II - XII are as described above under HEENT exam. In addition: shoulder shrug is normal with equal shoulder height noted. Motor exam: Normal bulk, strength and tone is noted. There is no drift, tremor or rebound. Romberg is negative. Reflexes are 2+ throughout. Babinski: Toes are flexor bilaterally. Fine motor skills and coordination: intact with normal finger taps, normal hand movements, normal rapid alternating patting, normal foot taps and normal foot agility.  Cerebellar testing: No dysmetria or intention tremor on finger to nose testing. Heel to shin is slightly difficult on the left because of left hip pain. There is no truncal or gait ataxia.  Sensory exam: intact to light touch, pinprick, vibration, temperature sense in the upper and lower extremities.  Gait, station and balance: He stands easily. No veering to one side is noted. No leaning to one side is noted. Posture is age-appropriate and stance is  narrow based. Gait shows normal stride length and normal pace. No problems turning are noted. He turns en bloc. Tandem walk is unremarkable.  Assessment and Plan:  In summary, George Patton is a very pleasant 44 y.o.-year old male with an underlying medical history of diabetes, hypertension, morbid obesity and a prior diagnosis of OSA, who reports snoring, excessive daytime somnolence, nonrestorative sleep. He has had difficult to control hypertension, rising blood sugar values and difficulty trying to lose weight. His history and physical exam are in keeping with obstructive sleep apnea.  I had a long chat with the patient about my findings and the diagnosis of OSA, its prognosis and treatment options. We talked about medical treatments, surgical interventions and non-pharmacological approaches. I explained in particular the risks and ramifications of untreated moderate to severe OSA, especially with respect to developing cardiovascular disease down the Road, including congestive heart failure, difficult to treat hypertension, cardiac arrhythmias, or stroke. Even type 2 diabetes has, in part, been linked to untreated OSA. Symptoms of untreated OSA include daytime sleepiness, memory problems, mood irritability and mood disorder such as depression and anxiety, lack of energy, as well as recurrent headaches, especially morning headaches. We talked about trying to maintain a healthy lifestyle in general, as well as the importance of weight control. I encouraged the patient to eat healthy, exercise daily and keep well hydrated, to keep a scheduled bedtime and wake time routine, to not skip any meals and eat healthy snacks in between meals. I advised the patient not to drive when feeling sleepy. I recommended the following at this time: sleep study with potential positive airway pressure titration. (We will score hypopneas at 4 and split the sleep study into diagnostic and treatment portion, if the estimated. 2 hour  AHI is >20/h).   I explained the sleep test procedure to the patient and also outlined possible surgical and non-surgical treatment options of OSA, including the use of a custom-made dental device (which would require a referral to a specialist dentist or oral surgeon), upper airway surgical options, such as pillar implants, radiofrequency surgery, tongue base surgery, and UPPP (which would involve a referral to an ENT surgeon). Rarely, jaw surgery such as mandibular advancement may be considered.  I also explained the CPAP treatment option to the patient, who indicated that he would be willing to try CPAP again if the need arises. I explained the importance of being compliant with PAP treatment, not only for insurance purposes but primarily to improve His symptoms, and for the patient's long term health benefit, including to reduce His cardiovascular risks. I answered  all his questions today and the patient was in agreement. I would like to see him back after the sleep study is completed and encouraged him to call with any interim questions, concerns, problems or updates.   Thank you very much for allowing me to participate in the care of this nice patient. If I can be of any further assistance to you please do not hesitate to call me at [phone].  Sincerely,   Star Age, MD, PhD

## 2014-06-14 NOTE — Patient Instructions (Signed)

## 2014-07-05 ENCOUNTER — Telehealth: Payer: Self-pay | Admitting: *Deleted

## 2014-08-15 ENCOUNTER — Other Ambulatory Visit: Payer: Self-pay

## 2014-08-15 DIAGNOSIS — R51 Headache: Secondary | ICD-10-CM

## 2014-08-15 DIAGNOSIS — G4733 Obstructive sleep apnea (adult) (pediatric): Secondary | ICD-10-CM

## 2014-08-15 DIAGNOSIS — R519 Headache, unspecified: Secondary | ICD-10-CM

## 2014-08-15 DIAGNOSIS — I10 Essential (primary) hypertension: Secondary | ICD-10-CM

## 2014-09-20 ENCOUNTER — Ambulatory Visit (INDEPENDENT_AMBULATORY_CARE_PROVIDER_SITE_OTHER): Payer: 59 | Admitting: Neurology

## 2014-09-20 DIAGNOSIS — G4733 Obstructive sleep apnea (adult) (pediatric): Secondary | ICD-10-CM | POA: Diagnosis not present

## 2014-09-20 DIAGNOSIS — G4761 Periodic limb movement disorder: Secondary | ICD-10-CM

## 2014-09-20 DIAGNOSIS — G4720 Circadian rhythm sleep disorder, unspecified type: Secondary | ICD-10-CM

## 2014-09-21 NOTE — Sleep Study (Signed)
Please see the scanned sleep study interpretation located in the Procedure tab within the Chart Review section. 

## 2014-09-22 ENCOUNTER — Telehealth: Payer: Self-pay | Admitting: Neurology

## 2014-09-22 NOTE — Telephone Encounter (Signed)
Patient referred by Dr. Lorelei Pont, seen by me on 06/14/14, PSG on 09/20/14:  Please call and notify the patient that the recent sleep study did not show any significant obstructive sleep apnea, but some OSA in REM/dream sleep and mild leg twitching in sleep. Please inform patient that I would like to go over the details of the study during a follow up appointment and if not already previously scheduled, arrange a followup appointment (please utilize a followu-up slot). Also, route or fax report to PCP and referring MD, if other than PCP.  Once you have spoken to patient, you can close this encounter.   Thanks,  Star Age, MD, PhD [facility])

## 2014-09-25 ENCOUNTER — Ambulatory Visit: Payer: 59 | Admitting: Family Medicine

## 2014-09-25 NOTE — Telephone Encounter (Signed)
I spoke to patient and he is aware of results and we were able to schedule f/u appt with Dr. Rexene Alberts. I will fax report to PCP.

## 2014-09-27 ENCOUNTER — Ambulatory Visit: Payer: 59 | Admitting: Family Medicine

## 2014-09-27 ENCOUNTER — Ambulatory Visit (INDEPENDENT_AMBULATORY_CARE_PROVIDER_SITE_OTHER): Payer: 59 | Admitting: Family Medicine

## 2014-09-27 ENCOUNTER — Encounter: Payer: Self-pay | Admitting: Family Medicine

## 2014-09-27 VITALS — BP 164/112 | HR 93 | Temp 99.7°F | Resp 18 | Ht 77.0 in | Wt 366.4 lb

## 2014-09-27 DIAGNOSIS — Z91013 Allergy to seafood: Secondary | ICD-10-CM

## 2014-09-27 DIAGNOSIS — E1165 Type 2 diabetes mellitus with hyperglycemia: Secondary | ICD-10-CM | POA: Diagnosis not present

## 2014-09-27 DIAGNOSIS — Z23 Encounter for immunization: Secondary | ICD-10-CM

## 2014-09-27 DIAGNOSIS — G4733 Obstructive sleep apnea (adult) (pediatric): Secondary | ICD-10-CM

## 2014-09-27 DIAGNOSIS — I10 Essential (primary) hypertension: Secondary | ICD-10-CM

## 2014-09-27 DIAGNOSIS — IMO0001 Reserved for inherently not codable concepts without codable children: Secondary | ICD-10-CM

## 2014-09-27 LAB — LIPID PANEL
Cholesterol: 179 mg/dL (ref 125–200)
HDL: 40 mg/dL (ref >=40)
LDL Cholesterol: 115 mg/dL (ref <130)
Total CHOL/HDL Ratio: 4.5 Ratio (ref <=5.0)
Triglycerides: 118 mg/dL (ref <150)
VLDL: 24 mg/dL (ref <30)

## 2014-09-27 LAB — COMPREHENSIVE METABOLIC PANEL
ALT: 37 U/L (ref 9–46)
AST: 22 U/L (ref 10–40)
Albumin: 4.5 g/dL (ref 3.6–5.1)
Alkaline Phosphatase: 81 U/L (ref 40–115)
BUN: 12 mg/dL (ref 7–25)
CO2: 25 mmol/L (ref 20–31)
Calcium: 9.3 mg/dL (ref 8.6–10.3)
Chloride: 104 mmol/L (ref 98–110)
Creat: 1.08 mg/dL (ref 0.60–1.35)
Glucose, Bld: 107 mg/dL — ABNORMAL HIGH (ref 65–99)
Potassium: 3.8 mmol/L (ref 3.5–5.3)
Sodium: 138 mmol/L (ref 135–146)
Total Bilirubin: 0.5 mg/dL (ref 0.2–1.2)
Total Protein: 7.2 g/dL (ref 6.1–8.1)

## 2014-09-27 LAB — HEMOGLOBIN A1C
Hgb A1c MFr Bld: 7.1 % — ABNORMAL HIGH (ref <5.7)
Mean Plasma Glucose: 157 mg/dL — ABNORMAL HIGH (ref <117)

## 2014-09-27 MED ORDER — AMLODIPINE BESYLATE 10 MG PO TABS: 10.0000 mg | ORAL_TABLET | Freq: Every day | ORAL | Status: DC

## 2014-09-27 MED ORDER — LISINOPRIL-HYDROCHLOROTHIAZIDE 20-12.5 MG PO TABS: 2.0000 | ORAL_TABLET | Freq: Every day | ORAL | Status: DC

## 2014-09-27 MED ORDER — EPINEPHRINE 0.3 MG/0.3ML IJ SOAJ: 0.3000 mg | Freq: Once | INTRAMUSCULAR | Status: AC

## 2014-09-27 NOTE — Progress Notes (Signed)
[facility] [address], [place] [address] [phone]  Date:  09/27/2014   Name:  George Patton   DOB:  1970-09-30   MRN:  [id number]  PCP:  Lamar Blinks, MD    Chief Complaint: Hypertension; Hyperlipidemia; and Diabetes   History of Present Illness:  George Patton is a 44 y.o. very pleasant male patient who presents with the following:  Here today for a recheck of his DM, HTN, OSA- however per neurology his OSA was mild and CPAP was not recommended at this time He is taking metformin, lisinopril/ hctz.    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   He does not have any CP or headache, does not note any sx of his HTN  BP Readings from Last 3 Encounters:  09/27/14 164/112  06/14/14 190/108  04/24/14 148/100    He had an allergic reaction to shellfish back in August while at [facility]- he had to be hospitalized.  He needs an epipen and referral to allergist.  He states that he had all over hives and the reaction was not thought due to lisinipril   Patient Active Problem List   Diagnosis Date Noted  . Type II or unspecified type diabetes mellitus without mention of complication, not stated as uncontrolled 05/16/2013  . OSA (obstructive sleep apnea) 01/24/2013  . Obesity, unspecified 01/24/2013  . Hypertension 02/12/2011    Past Medical History  Diagnosis Date  . Allergy   . Asthma   . Hypertension   . Diabetes mellitus without complication     Past Surgical History  Procedure Laterality Date  . Vasectomy    . Hernia repair      Social History  Substance Use Topics  . Smoking  status: Former Smoker -- 0.25 packs/day    Types: Cigarettes    Quit date: 08/06/2013  . Smokeless tobacco: None  . Alcohol Use: 0.6 oz/week    1 Shots of liquor per week     Comment: Once a month     Family History  Problem Relation Age of Onset  . Asthma Mother   . Migraines Sister   . Asthma Son   . Cancer Father     Allergies  Allergen Reactions  . Shellfish Allergy Anaphylaxis    Medication list has been reviewed and updated.  Current Outpatient Prescriptions on File Prior to Visit  Medication Sig Dispense Refill  . albuterol (PROVENTIL HFA;VENTOLIN HFA) 108 (90 BASE) MCG/ACT inhaler Inhale 2 puffs into the lungs every 6 (six) hours as needed for wheezing or shortness of breath. 1 Inhaler 2  . lisinopril (PRINIVIL,ZESTRIL) 20 MG tablet Start with a 1/2 tablet in the evening.  Adjust as directed (Patient taking differently: Take 20 mg by mouth daily. Take 1 tablet in the evening) 90 tablet 3  . lisinopril-hydrochlorothiazide (PRINZIDE,ZESTORETIC) 20-25 MG per tablet Take 1 tablet by mouth daily. Need office visit for additional refills. 90 tablet 3  . metFORMIN (GLUCOPHAGE) 500 MG tablet Take one pill at bedtime.  Increase to twice a  day as directed (Patient taking differently: Take one tablet in the am and one tablet at bedtime) 180 tablet 3   No current facility-administered medications on file prior to visit.    Review of Systems:  As per HPI- otherwise negative.   Physical Examination: Filed Vitals:   09/27/14 1136  BP: 164/112  Pulse:   Temp:   Resp:    Filed Vitals:   09/27/14 1126  Height: 6\' 5"  (1.956 m)  Weight: 366 lb 6.4 oz (166.198 kg)   Body mass index is 43.44 kg/(m^2). Ideal Body Weight: Weight in (lb) to have BMI = 25: 210.4  GEN: WDWN, NAD, Non-toxic, A & O x 3, obese, large build.  Looks well HEENT: Atraumatic, Normocephalic. Neck supple. No masses, No LAD. Ears and Nose: No external deformity. CV: RRR, No M/G/R. No JVD. No thrill. No  extra heart sounds. PULM: CTA B, no wheezes, crackles, rhonchi. No retractions. No resp. distress. No accessory muscle use. EXTR: No c/c/e NEURO Normal gait.  PSYCH: Normally interactive. Conversant. Not depressed or anxious appearing.  Calm demeanor.    Assessment and Plan: Essential hypertension - Plan: Comprehensive metabolic panel, lisinopril-hydrochlorothiazide (PRINZIDE,ZESTORETIC) 20-12.5 MG per tablet, amLODipine (NORVASC) 10 MG tablet  Need for influenza vaccination - Plan: Flu Vaccine QUAD 36+ mos IM  Diabetes mellitus type 2, uncontrolled, without complications - Plan: Comprehensive metabolic panel, Lipid panel, Hemoglobin A1c  Shellfish allergy - Plan: EPINEPHrine (EPIPEN 2-PAK) 0.3 mg/0.3 mL IJ SOAJ injection, Ambulatory referral to Allergy  His BP is still high- will add norvasc. He will keep me posted Referral to allergy and given epipen Await labs Encouraged further weight loss  Signed Lamar Blinks, MD

## 2014-09-27 NOTE — Patient Instructions (Signed)
For your blood pressure let's change you to the following regimen-  you will take 2 of the lisinopril/hctz pills once a day (in the am) and the amlodipine once a day Start with a 1/2 amlodipine pill for about a week.   Assuming you do not have any side effects and your BP does not get too low (under 130/85- please check at the drug store) you can go to a whole tablet I will get you referred to an allergist and have rx an epipen for you- please keep this handy!   Meds ordered this encounter  Medications  . EPINEPHrine (EPIPEN 2-PAK) 0.3 mg/0.3 mL IJ SOAJ injection    Sig: Inject 0.3 mLs (0.3 mg total) into the muscle once. Repeat if needed    Dispense:  2 Device    Refill:  1  . lisinopril-hydrochlorothiazide (PRINZIDE,ZESTORETIC) 20-12.5 MG per tablet    Sig: Take 2 tablets by mouth daily.    Dispense:  180 tablet    Refill:  3  . amLODipine (NORVASC) 10 MG tablet    Sig: Take 1 tablet (10 mg total) by mouth daily.    Dispense:  90 tablet    Refill:  3

## 2014-10-12 ENCOUNTER — Ambulatory Visit: Payer: Self-pay | Admitting: Neurology

## 2014-11-02 ENCOUNTER — Telehealth: Payer: Self-pay

## 2014-11-02 ENCOUNTER — Ambulatory Visit: Payer: 59 | Admitting: Neurology

## 2014-11-02 NOTE — Telephone Encounter (Signed)
Patient did not show to appt today  

## 2014-11-07 ENCOUNTER — Encounter: Payer: Self-pay | Admitting: Neurology

## 2015-01-26 ENCOUNTER — Encounter: Payer: Self-pay | Admitting: Family Medicine

## 2015-01-30 LAB — HM DIABETES EYE EXAM

## 2015-01-31 ENCOUNTER — Encounter: Payer: Self-pay | Admitting: Family Medicine

## 2015-01-31 ENCOUNTER — Ambulatory Visit: Payer: 59 | Admitting: Family Medicine

## 2015-02-05 ENCOUNTER — Encounter: Payer: Self-pay | Admitting: Family Medicine

## 2015-02-07 ENCOUNTER — Encounter: Payer: Self-pay | Admitting: Family Medicine

## 2015-02-07 ENCOUNTER — Ambulatory Visit (INDEPENDENT_AMBULATORY_CARE_PROVIDER_SITE_OTHER): Payer: 59 | Admitting: Family Medicine

## 2015-02-07 VITALS — BP 138/98 | HR 96 | Temp 98.2°F | Resp 16 | Wt 377.0 lb

## 2015-02-07 DIAGNOSIS — E119 Type 2 diabetes mellitus without complications: Secondary | ICD-10-CM | POA: Diagnosis not present

## 2015-02-07 DIAGNOSIS — E669 Obesity, unspecified: Secondary | ICD-10-CM | POA: Diagnosis not present

## 2015-02-07 DIAGNOSIS — I10 Essential (primary) hypertension: Secondary | ICD-10-CM

## 2015-02-07 LAB — HEMOGLOBIN A1C
Hgb A1c MFr Bld: 7.4 % — ABNORMAL HIGH (ref <5.7)
Mean Plasma Glucose: 166 mg/dL — ABNORMAL HIGH (ref <117)

## 2015-02-07 NOTE — Progress Notes (Signed)
[facility] [address], [place] [address] [phone]  Date:  02/07/2015   Name:  George Patton   DOB:  04/25/1970   MRN:  [id number]  PCP:  Lamar Blinks, MD    Chief Complaint: Hypertension   History of Present Illness:  George Patton is a 45 y.o. very pleasant male patient who presents with the following:  History of HTN, obesity, DM.  Here today for a follow-up visit. At his last visit we added amlodipine His home BP are running better for him at home.   He has not noted any SE except for occasional mild dizziness Per his home scale he has lost weight- he thinks that his clinic weight in September was in error and he was actually heavier at that time   No SOB or CP- he did just drink coffee however this am which may be why his pulse is up a bit  Lab Results  Component Value Date   HGBA1C 7.1* 09/27/2014   BP Readings from Last 3 Encounters:  02/07/15 138/98  09/27/14 164/112  06/14/14 190/108   Wt Readings from Last 3 Encounters:  02/07/15 377 lb (171.006 kg)  09/27/14 366 lb 6.4 oz (166.198 kg)  06/14/14 370 lb (167.831 kg)     Patient Active Problem List   Diagnosis Date Noted  . Type II or unspecified type diabetes mellitus without mention of complication, not stated as uncontrolled 05/16/2013  . OSA (obstructive sleep apnea) 01/24/2013  . Obesity, unspecified 01/24/2013  . Hypertension 02/12/2011    Past Medical History  Diagnosis Date  . Allergy   . Asthma   . Hypertension   . Diabetes mellitus without complication [facility])     Past Surgical History  Procedure Laterality Date  . Vasectomy    . Hernia repair      Social History  Substance Use Topics  . Smoking status: Former Smoker -- 0.25 packs/day    Types: Cigarettes    Quit date: 08/06/2013  . Smokeless tobacco: None  . Alcohol Use: 0.6 oz/week    1 Shots of liquor per week     Comment: Once a month     Family History  Problem Relation Age of Onset  . Asthma  Mother   . Migraines Sister   . Asthma Son   . Cancer Father     Allergies  Allergen Reactions  . Shellfish Allergy Anaphylaxis    Medication list has been reviewed and updated.  Current Outpatient Prescriptions on File Prior to Visit  Medication Sig Dispense Refill  . albuterol (PROVENTIL HFA;VENTOLIN HFA) 108 (90 BASE) MCG/ACT inhaler Inhale 2 puffs into the lungs every 6 (six) hours as needed for wheezing or shortness of breath. 1 Inhaler 2  . amLODipine (NORVASC) 10 MG tablet Take 1 tablet (10 mg total) by mouth daily. 90 tablet 3  . EPINEPHrine (EPIPEN 2-PAK) 0.3 mg/0.3 mL IJ SOAJ injection Inject 0.3 mLs (0.3 mg total) into the muscle once. Repeat if needed 2 Device 1  . lisinopril-hydrochlorothiazide (PRINZIDE,ZESTORETIC) 20-12.5 MG per tablet Take 2 tablets by mouth daily. 180 tablet 3  . metFORMIN (GLUCOPHAGE) 500 MG tablet Take one pill at bedtime.  Increase to twice a day as directed (Patient taking differently: Take one tablet in the am and one tablet at bedtime) 180 tablet 3   No current facility-administered medications on file prior to visit.    Review of Systems:  As per HPI- otherwise negative.  Physical Examination: Filed Vitals:   02/07/15 1043  BP: 138/98  Pulse: 112  Temp: 98.2 F (36.8 C)  Resp: 16   Filed Vitals:   02/07/15 1043  Weight: 377 lb (171.006 kg)   Body mass index is 44.7 kg/(m^2). Ideal Body Weight:    GEN: WDWN, NAD, Non-toxic, A & O x 3, large build, obese HEENT: Atraumatic, Normocephalic. Neck supple. No masses, No LAD. Ears and Nose: No external deformity. CV: RRR, No M/G/R. No JVD. No thrill. No extra heart sounds. PULM: CTA B, no wheezes, crackles, rhonchi. No retractions. No resp. distress. No accessory muscle use. EXTR: No c/c/e NEURO Normal gait.  PSYCH: Normally interactive. Conversant. Not depressed or anxious appearing.  Calm demeanor.    Assessment and Plan: Essential hypertension  Controlled type 2 diabetes  mellitus without complication, without long-term current use of insulin ([place]) - Plan: Hemoglobin A1c  Obesity  He is quite obese but is working on this.  Await his labs BP is better with addition of amlodipine.    Signed Lamar Blinks, MD

## 2015-02-07 NOTE — Patient Instructions (Signed)
It was a pleasure to see you as always I am pleased with the progress on your blood pressure!  I will be in touch with your A1c; assuming this continues to look good we can plan to recheck in 4-6 months.    It would be my pleasure to continue to see you as a patient at my new office, starting the last week of February.  If you prefer to remain at [facility] one of my partners will be happy to see you here.   [facility] [address] [place], [place] [address] Phone: [phone]

## 2015-03-14 ENCOUNTER — Encounter: Payer: 59 | Admitting: Family Medicine

## 2015-05-24 ENCOUNTER — Other Ambulatory Visit: Payer: Self-pay | Admitting: Family Medicine

## 2015-05-24 ENCOUNTER — Other Ambulatory Visit: Payer: Self-pay | Admitting: Emergency Medicine

## 2015-05-24 DIAGNOSIS — E118 Type 2 diabetes mellitus with unspecified complications: Secondary | ICD-10-CM

## 2015-05-24 MED ORDER — METFORMIN HCL 500 MG PO TABS: ORAL_TABLET | ORAL | Status: DC

## 2015-06-20 ENCOUNTER — Encounter: Payer: Self-pay | Admitting: Family Medicine

## 2015-06-20 ENCOUNTER — Ambulatory Visit (INDEPENDENT_AMBULATORY_CARE_PROVIDER_SITE_OTHER): Payer: 59 | Admitting: Family Medicine

## 2015-06-20 VITALS — BP 144/98 | HR 96 | Temp 98.3°F | Ht 75.0 in | Wt 369.2 lb

## 2015-06-20 DIAGNOSIS — R0789 Other chest pain: Secondary | ICD-10-CM | POA: Diagnosis not present

## 2015-06-20 DIAGNOSIS — Z130 Encounter for screening for diseases of the blood and blood-forming organs and certain disorders involving the immune mechanism: Secondary | ICD-10-CM | POA: Diagnosis not present

## 2015-06-20 DIAGNOSIS — E119 Type 2 diabetes mellitus without complications: Secondary | ICD-10-CM

## 2015-06-20 DIAGNOSIS — Z125 Encounter for screening for malignant neoplasm of prostate: Secondary | ICD-10-CM

## 2015-06-20 DIAGNOSIS — E785 Hyperlipidemia, unspecified: Secondary | ICD-10-CM | POA: Diagnosis not present

## 2015-06-20 DIAGNOSIS — E669 Obesity, unspecified: Secondary | ICD-10-CM | POA: Diagnosis not present

## 2015-06-20 DIAGNOSIS — Z0000 Encounter for general adult medical examination without abnormal findings: Secondary | ICD-10-CM

## 2015-06-20 DIAGNOSIS — I10 Essential (primary) hypertension: Secondary | ICD-10-CM

## 2015-06-20 DIAGNOSIS — R21 Rash and other nonspecific skin eruption: Secondary | ICD-10-CM

## 2015-06-20 LAB — LIPID PANEL
Cholesterol: 189 mg/dL (ref 0–200)
HDL: 40.9 mg/dL (ref >39.00)
LDL Cholesterol: 121 mg/dL — ABNORMAL HIGH (ref 0–99)
NonHDL: 148.2
Total CHOL/HDL Ratio: 5
Triglycerides: 138 mg/dL (ref 0.0–149.0)
VLDL: 27.6 mg/dL (ref 0.0–40.0)

## 2015-06-20 LAB — CBC
HCT: 44.7 % (ref 39.0–52.0)
Hemoglobin: 14.6 g/dL (ref 13.0–17.0)
MCHC: 32.7 g/dL (ref 30.0–36.0)
MCV: 83.9 fl (ref 78.0–100.0)
Platelets: 255 10*3/uL (ref 150.0–400.0)
RBC: 5.33 Mil/uL (ref 4.22–5.81)
RDW: 15 % (ref 11.5–15.5)
WBC: 8.8 10*3/uL (ref 4.0–10.5)

## 2015-06-20 LAB — COMPREHENSIVE METABOLIC PANEL
ALT: 36 U/L (ref 0–53)
AST: 21 U/L (ref 0–37)
Albumin: 4.5 g/dL (ref 3.5–5.2)
Alkaline Phosphatase: 88 U/L (ref 39–117)
BUN: 15 mg/dL (ref 6–23)
CO2: 28 mEq/L (ref 19–32)
Calcium: 9.7 mg/dL (ref 8.4–10.5)
Chloride: 104 mEq/L (ref 96–112)
Creatinine, Ser: 1.18 mg/dL (ref 0.40–1.50)
GFR: 85.48 mL/min (ref >60.00)
Glucose, Bld: 136 mg/dL — ABNORMAL HIGH (ref 70–99)
Potassium: 3.9 mEq/L (ref 3.5–5.1)
Sodium: 141 mEq/L (ref 135–145)
Total Bilirubin: 0.6 mg/dL (ref 0.2–1.2)
Total Protein: 7.3 g/dL (ref 6.0–8.3)

## 2015-06-20 LAB — PSA: PSA: 0.47 ng/mL (ref 0.10–4.00)

## 2015-06-20 LAB — TROPONIN I: TNIDX: 0.01 ug/l (ref 0.00–0.06)

## 2015-06-20 LAB — HEMOGLOBIN A1C: Hgb A1c MFr Bld: 7.6 % — ABNORMAL HIGH (ref 4.6–6.5)

## 2015-06-20 NOTE — Patient Instructions (Addendum)
Your EKG is reassuring today I will check a troponin today (blood test that looks for any damage to the heart muscle) and will refer you to cardiology I suspect that they will want you to do a stress test  If you have any change or worsening of your chest pain please seek care right away!!   Will also refer you to dermatology today and will be in touch with your labs Please look over the info I gave you regarding bariatric surgery- this may be a good option for you   I will be in touch with your labs asap

## 2015-06-20 NOTE — Progress Notes (Signed)
[place] [facility] [address], [place], [place] [address] [phone] [id number] [phone]  Date:  06/20/2015   Name:  George Patton   DOB:  Sep 01, 1970   MRN:  [id number]  PCP:  Lamar Blinks, MD    Chief Complaint: Annual Exam   History of Present Illness:  George Patton is a 45 y.o. very pleasant male patient who presents with the following:  History of DM, HTN, obesity, OSA.  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   He does not generally check his BP at home We do annual PSA for him  He did have a stressful morning as his sewer line was backed up at home- wonders if this is why his BP is up some However he also does describe some left sided CP over the last month- will be momentary and non- exertional. May occur any time, lasts only a few seconds. No CP with exertion while he is working He does not have any personal history of CAD Mother had an issue with her mitral valve but no CAD per his knowledge  BP Readings from Last 3 Encounters:  06/20/15 179/99  02/07/15 138/98  09/27/14 164/112    Lab Results  Component Value Date   HGBA1C 7.4* 02/07/2015   Wt Readings from Last 3 Encounters:  06/20/15 369 lb 3.2 oz (167.468 kg)  02/07/15 377 lb (171.006 kg)  09/27/14 366 lb 6.4 oz (166.198 kg)     Patient Active Problem List   Diagnosis Date Noted  . Type II or unspecified type diabetes mellitus without mention of complication, not stated as uncontrolled 05/16/2013  . OSA (obstructive sleep apnea) 01/24/2013  . Obesity, unspecified  01/24/2013  . Hypertension 02/12/2011    Past Medical History  Diagnosis Date  . Allergy   . Asthma   . Hypertension   . Diabetes mellitus without complication [facility])     Past Surgical History  Procedure Laterality Date  . Vasectomy    . Hernia repair      Social History  Substance Use Topics  . Smoking status: Former Smoker -- 0.25 packs/day    Types: Cigarettes    Quit date: 08/06/2013  . Smokeless tobacco: None  . Alcohol Use: 0.6 oz/week    1 Shots of liquor per week     Comment: Once a month     Family History  Problem Relation Age of Onset  . Asthma Mother   . Migraines Sister   . Asthma Son   . Cancer Father     Allergies  Allergen Reactions  . Shellfish Allergy Anaphylaxis    Medication list has been reviewed and updated.  Current Outpatient Prescriptions on File Prior to Visit  Medication Sig Dispense Refill  . albuterol (PROVENTIL HFA;VENTOLIN HFA) 108 (90 BASE) MCG/ACT inhaler Inhale 2 puffs into the lungs every 6 (six) hours as needed for wheezing or shortness of breath. 1 Inhaler 2  . amLODipine (NORVASC) 10  MG tablet Take 1 tablet (10 mg total) by mouth daily. 90 tablet 3  . EPINEPHrine (EPIPEN 2-PAK) 0.3 mg/0.3 mL IJ SOAJ injection Inject 0.3 mLs (0.3 mg total) into the muscle once. Repeat if needed 2 Device 1  . lisinopril-hydrochlorothiazide (PRINZIDE,ZESTORETIC) 20-12.5 MG per tablet Take 2 tablets by mouth daily. 180 tablet 3  . metFORMIN (GLUCOPHAGE) 500 MG tablet Take one pill at bedtime.  Increase to twice a day as directed 180 tablet 3   No current facility-administered medications on file prior to visit.    Review of Systems:  As per HPI- otherwise negative.   Physical Examination: Filed Vitals:   06/20/15 0949  BP: 179/99  Pulse: 96  Temp: 98.3 F (36.8 C)   Filed Vitals:   06/20/15 0949  Height: 6\' 3"  (1.905 m)  Weight: 369 lb 3.2 oz (167.468 kg)   Body mass index is 46.15 kg/(m^2). Ideal Body Weight: Weight in (lb) to  have BMI = 25: 199.6  GEN: WDWN, NAD, Non-toxic, A & O x 3, obese/ large build, looks well HEENT: Atraumatic, Normocephalic. Neck supple. No masses, No LAD. Ears and Nose: No external deformity. CV: RRR, No M/G/R. No JVD. No thrill. No extra heart sounds. PULM: CTA B, no wheezes, crackles, rhonchi. No retractions. No resp. distress. No accessory muscle use. ABD: S, NT, ND, +BS. No rebound. No HSM. EXTR: No c/c/e NEURO Normal gait.  PSYCH: Normally interactive. Conversant. Not depressed or anxious appearing.  Calm demeanor.  He notes a chronic skin problem on the nape of his neck- there for many years.  Never been able to get it to go away- he will have nodules and cysts.  Would like to see derm  EKG: SR, no acute or concerning findings.  No old for comparison on chart   SpO2 Readings from Last 3 Encounters:  06/20/15 96%  02/07/15 97%  09/27/14 96%    Assessment and Plan: Physical exam - Plan: EKG 12-Lead  Other chest pain - Plan: EKG 12-Lead, Troponin I, Ambulatory referral to Cardiology  Obesity, unspecified  Controlled type 2 diabetes mellitus without complication, without long-term current use of insulin ([place]) - Plan: Comprehensive metabolic panel, Hemoglobin A1c  Essential hypertension  Screening for deficiency anemia - Plan: CBC  Screening for prostate cancer - Plan: PSA  Hyperlipidemia - Plan: Lipid panel  Rash and nonspecific skin eruption - Plan: Ambulatory referral to Dermatology  discucssed worrisome chest pain changes (pain with exertion, pain lasting more than 5 min, pain with sweating, left arm or jaw pain) that should prompt immediate evaluation.  Otherwise refer to cards for likely stress test, check troponin  Given hand- out regarding the bariatric program through [facility] and encouraged him to think about this Otherwise await his labs and will be in touch with him  Signed Lamar Blinks, MD

## 2015-06-20 NOTE — Progress Notes (Signed)
Pre visit review using our clinic review tool, if applicable. No additional management support is needed unless otherwise documented below in the visit note. 

## 2015-07-04 ENCOUNTER — Ambulatory Visit: Payer: 59 | Admitting: Cardiovascular Disease

## 2015-08-09 ENCOUNTER — Encounter: Payer: Self-pay | Admitting: Family Medicine

## 2015-08-09 DIAGNOSIS — R234 Changes in skin texture: Secondary | ICD-10-CM

## 2015-08-10 ENCOUNTER — Encounter: Payer: Self-pay | Admitting: Family Medicine

## 2015-08-23 ENCOUNTER — Encounter: Payer: Self-pay | Admitting: Cardiovascular Disease

## 2015-09-18 ENCOUNTER — Ambulatory Visit: Payer: 59 | Admitting: Cardiovascular Disease

## 2015-09-19 ENCOUNTER — Ambulatory Visit (INDEPENDENT_AMBULATORY_CARE_PROVIDER_SITE_OTHER): Payer: 59 | Admitting: Family Medicine

## 2015-09-19 ENCOUNTER — Encounter: Payer: Self-pay | Admitting: Family Medicine

## 2015-09-19 ENCOUNTER — Ambulatory Visit (HOSPITAL_BASED_OUTPATIENT_CLINIC_OR_DEPARTMENT_OTHER)
Admission: RE | Admit: 2015-09-19 | Discharge: 2015-09-19 | Disposition: A | Discharge status: Home / Self Care | Payer: 59 | Source: Ambulatory Visit | Attending: Family Medicine | Admitting: Family Medicine

## 2015-09-19 VITALS — BP 162/95 | HR 97 | Temp 98.5°F | Ht 75.0 in | Wt 375.6 lb

## 2015-09-19 DIAGNOSIS — E119 Type 2 diabetes mellitus without complications: Secondary | ICD-10-CM

## 2015-09-19 DIAGNOSIS — M542 Cervicalgia: Secondary | ICD-10-CM | POA: Diagnosis not present

## 2015-09-19 DIAGNOSIS — E669 Obesity, unspecified: Secondary | ICD-10-CM

## 2015-09-19 DIAGNOSIS — E785 Hyperlipidemia, unspecified: Secondary | ICD-10-CM

## 2015-09-19 DIAGNOSIS — Z23 Encounter for immunization: Secondary | ICD-10-CM

## 2015-09-19 DIAGNOSIS — I10 Essential (primary) hypertension: Secondary | ICD-10-CM

## 2015-09-19 DIAGNOSIS — M47892 Other spondylosis, cervical region: Secondary | ICD-10-CM | POA: Insufficient documentation

## 2015-09-19 LAB — COMPREHENSIVE METABOLIC PANEL
ALT: 46 U/L (ref 0–53)
AST: 27 U/L (ref 0–37)
Albumin: 4.4 g/dL (ref 3.5–5.2)
Alkaline Phosphatase: 80 U/L (ref 39–117)
BUN: 17 mg/dL (ref 6–23)
CO2: 31 mEq/L (ref 19–32)
Calcium: 9.4 mg/dL (ref 8.4–10.5)
Chloride: 101 mEq/L (ref 96–112)
Creatinine, Ser: 1.3 mg/dL (ref 0.40–1.50)
GFR: 76.35 mL/min (ref >60.00)
Glucose, Bld: 172 mg/dL — ABNORMAL HIGH (ref 70–99)
Potassium: 3.6 mEq/L (ref 3.5–5.1)
Sodium: 140 mEq/L (ref 135–145)
Total Bilirubin: 0.6 mg/dL (ref 0.2–1.2)
Total Protein: 7.6 g/dL (ref 6.0–8.3)

## 2015-09-19 LAB — CK: Total CK: 276 U/L — ABNORMAL HIGH (ref 7–232)

## 2015-09-19 LAB — HEMOGLOBIN A1C: Hgb A1c MFr Bld: 7.5 % — ABNORMAL HIGH (ref 4.6–6.5)

## 2015-09-19 MED ORDER — METHOCARBAMOL 500 MG PO TABS: 500.0000 mg | ORAL_TABLET | Freq: Four times a day (QID) | ORAL | 0 refills | Status: DC | PRN

## 2015-09-19 NOTE — Patient Instructions (Addendum)
It is always a pleasure to see you in the office- please go to lab and then downstairs for x-rays of your neck You got your flu shot today, and I will be in touch with your labs and x-ray report Try the robaxin (muscle relaxer) as needed for neck pain; remember this can make you feel a bit sleepy.  You might also try a soft cervical collar that you can purchase OTC as needed  Your blood pressure is high today- this may be because you just took your medication. However I also did notice that you have gained weight.  Please work hard on this to reduce your risk of a heart attack or stroke.    Please come and see me in 4 months for a recheck

## 2015-09-19 NOTE — Progress Notes (Addendum)
[place] at [facility] [address], [place], [place] [address] [phone] [phone]  Date:  09/19/2015   Name:  George Patton   DOB:  May 12, 1970   MRN:  [id number]  PCP:  Lamar Blinks, MD    Chief Complaint: Neck Pain (c/o neck pain that started about 3 week ago. Started on the right side of neck and has now spread. Pain is always present but some days worse than others. )   History of Present Illness:  George Patton is a 45 y.o. very pleasant male patient who presents with the following:  History of DM, OSA, HTN, obesity Here today with complaint of pain in his neck- he noted a pain in his right neck that he noted one morning about 3 weeks ago He is not aware of any inciting injury, no new activities that he can recall. The pain is more intermittent now. He now has left sided pain as well for the last 2 weeks.  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  His oldest is taking her senior year of HS in [place], and plans to stay there for a gap year following this year.  His other kids are also doing well Noted that he has gained some weight and his BP is higher today At last labs we noted slightly high LDL- given his DM would recommend a statin.  Discussed with him again today- he declines this medication at this time    No known history of OA in his neck He just took his BP med 30 min prior to appt, which may be why his BP is high today   Wt Readings from Last 3 Encounters:  09/19/15 (!) 375 lb 9.6 oz (170.4 kg)  06/20/15 (!) 369 lb 3.2 oz  (167.5 kg)  02/07/15 (!) 377 lb (171 kg)   BP Readings from Last 3 Encounters:  09/19/15 (!) 179/110  06/20/15 (!) 144/98  02/07/15 (!) 138/98   Lab Results  Component Value Date   HGBA1C 7.6 (H) 06/20/2015     Patient Active Problem List   Diagnosis Date Noted  . Type II or unspecified type diabetes mellitus without mention of complication, not stated as uncontrolled 05/16/2013  . OSA (obstructive sleep apnea) 01/24/2013  . Obesity, unspecified 01/24/2013  . Hypertension 02/12/2011    Past Medical History:  Diagnosis Date  . Allergy   . Asthma   . Diabetes mellitus without complication ([place])   . Hypertension     Past Surgical History:  Procedure Laterality Date  . HERNIA REPAIR    . VASECTOMY      Social History  Substance Use Topics  . Smoking status: Former Smoker    Packs/day: 0.25    Types: Cigarettes    Quit date: 08/06/2013  . Smokeless tobacco: Not on file  . Alcohol use 0.6 oz/week    1 Shots of liquor per week     Comment: Once a month     Family History  Problem  Relation Age of Onset  . Asthma Mother   . Migraines Sister   . Asthma Son   . Cancer Father     Allergies  Allergen Reactions  . Shellfish Allergy Anaphylaxis    Medication list has been reviewed and updated.  Current Outpatient Prescriptions on File Prior to Visit  Medication Sig Dispense Refill  . albuterol (PROVENTIL HFA;VENTOLIN HFA) 108 (90 BASE) MCG/ACT inhaler Inhale 2 puffs into the lungs every 6 (six) hours as needed for wheezing or shortness of breath. 1 Inhaler 2  . amLODipine (NORVASC) 10 MG tablet Take 1 tablet (10 mg total) by mouth daily. 90 tablet 3  . EPINEPHrine (EPIPEN 2-PAK) 0.3 mg/0.3 mL IJ SOAJ injection Inject 0.3 mLs (0.3 mg total) into the muscle once. Repeat if needed 2 Device 1  . lisinopril-hydrochlorothiazide (PRINZIDE,ZESTORETIC) 20-12.5 MG per tablet Take 2 tablets by mouth daily. 180 tablet 3  . metFORMIN (GLUCOPHAGE) 500 MG tablet Take one pill at  bedtime.  Increase to twice a day as directed 180 tablet 3   No current facility-administered medications on file prior to visit.     Review of Systems:  As per HPI- otherwise negative. No other muscle pain, no fever, chills, CP or SOB   Physical Examination: Vitals:   09/19/15 0908  BP: (!) 179/110  Pulse: 97  Temp: 98.5 F (36.9 C)   Vitals:   09/19/15 0908  Weight: (!) 375 lb 9.6 oz (170.4 kg)  Height: 6\' 3"  (1.905 m)   Body mass index is 46.95 kg/m. Ideal Body Weight: Weight in (lb) to have BMI = 25: 199.6  GEN: WDWN, NAD, Non-toxic, A & O x 3, obese/ large build, looks well HEENT: Atraumatic, Normocephalic. Neck supple. No masses, No LAD.  Bilateral TM wnl, oropharynx normal.  PEERL,EOMI.   He does not have any bony tenderness in the neck.  He notes paracervical muscular tenderness more so on the right.  Normal rotation to the left, rotation to only about 45 on the right.  Normal flexion and extension Ears and Nose: No external deformity. CV: RRR, No M/G/R. No JVD. No thrill. No extra heart sounds. PULM: CTA B, no wheezes, crackles, rhonchi. No retractions. No resp. distress. No accessory muscle use. EXTR: No c/c/e.  Normal BUE strength, sensation, and DTR.  NEURO Normal gait.  PSYCH: Normally interactive. Conversant. Not depressed or anxious appearing.  Calm demeanor.   Dg Cervical Spine Complete  Result Date: 09/19/2015 CLINICAL DATA:  Bilateral neck pain 2-3 weeks.  No injury. EXAM: CERVICAL SPINE - COMPLETE 4+ VIEW COMPARISON:  None. FINDINGS: Vertebral body alignment, heights and disc space heights are normal. There is mild spondylosis of the cervical spine. Prevertebral soft tissues are normal. No significant neural foraminal narrowing. Atlantoaxial articulation is within normal. IMPRESSION: No acute findings. Mild spondylosis of the cervical spine. Electronically Signed   By: Marin Olp M.D.   On: 09/19/2015 10:44     Assessment and Plan: Neck pain - Plan:  CK (Creatine Kinase), DG Cervical Spine Complete, methocarbamol (ROBAXIN) 500 MG tablet  Immunization due  Essential hypertension - Plan: Comprehensive metabolic panel  Controlled type 2 diabetes mellitus without complication, without long-term current use of insulin (HCC) - Plan: Comprehensive metabolic panel, Hemoglobin A1c  Obesity, unspecified  Dyslipidemia  It is always a pleasure to see you in the office- please go to lab and then downstairs for x-rays of your neck You got your flu shot today, and I will be in touch with  your labs and x-ray report Try the robaxin (muscle relaxer) as needed for neck pain; remember this can make you feel a bit sleepy.  You might also try a soft cervical collar that you can purchase OTC as needed  Your blood pressure is high today- this may be because you just took your medication. However I also did notice that you have gained weight.  Please work hard on this to reduce your risk of a heart attack or stroke.    Please come and see me in 4 months for a recheck   As above, he does not wish to use a statin for the time being  Continue BP meds and work on weight loss Will check A1c for glucose control CK for neck pain- however suspect this is a benign muscular pain Signed Lamar Blinks, MD  Results for orders placed or performed in visit on 09/19/15  Comprehensive metabolic panel  Result Value Ref Range   Sodium 140 135 - 145 mEq/L   Potassium 3.6 3.5 - 5.1 mEq/L   Chloride 101 96 - 112 mEq/L   CO2 31 19 - 32 mEq/L   Glucose, Bld 172 (H) 70 - 99 mg/dL   BUN 17 6 - 23 mg/dL   Creatinine, Ser 1.30 0.40 - 1.50 mg/dL   Total Bilirubin 0.6 0.2 - 1.2 mg/dL   Alkaline Phosphatase 80 39 - 117 U/L   AST 27 0 - 37 U/L   ALT 46 0 - 53 U/L   Total Protein 7.6 6.0 - 8.3 g/dL   Albumin 4.4 3.5 - 5.2 g/dL   Calcium 9.4 8.4 - 10.5 mg/dL   GFR 76.35 >60.00 mL/min  Hemoglobin A1c  Result Value Ref Range   Hgb A1c MFr Bld 7.5 (H) 4.6 - 6.5 %  CK  (Creatine Kinase)  Result Value Ref Range   Total CK 276 (H) 7 - 232 U/L

## 2015-10-04 ENCOUNTER — Ambulatory Visit (INDEPENDENT_AMBULATORY_CARE_PROVIDER_SITE_OTHER): Payer: 59 | Admitting: Cardiovascular Disease

## 2015-10-04 ENCOUNTER — Encounter: Payer: Self-pay | Admitting: Cardiovascular Disease

## 2015-10-04 VITALS — BP 152/88 | HR 93 | Ht 76.0 in | Wt 372.6 lb

## 2015-10-04 DIAGNOSIS — E785 Hyperlipidemia, unspecified: Secondary | ICD-10-CM

## 2015-10-04 DIAGNOSIS — R0789 Other chest pain: Secondary | ICD-10-CM

## 2015-10-04 DIAGNOSIS — I10 Essential (primary) hypertension: Secondary | ICD-10-CM | POA: Diagnosis not present

## 2015-10-04 NOTE — Patient Instructions (Signed)
Medication Instructions:  Your physician recommends that you continue on your current medications as directed. Please refer to the Current Medication list given to you today.  Labwork: FASTING LP/CMET BEFORE FOLLOW UP VISIT   Testing/Procedures: NONE  Follow-Up: Your physician wants you to follow-up in: [place] will receive a reminder letter in the mail two months in advance. If you don't receive a letter, please call our office to schedule the follow-up appointment.  If you need a refill on your cardiac medications before your next appointment, please call your pharmacy.

## 2015-10-04 NOTE — Progress Notes (Signed)
Cardiology Office Note   Date:  10/04/2015   ID:  George Patton, DOB [birth year], MRN [id number]  PCP:  George Blinks, MD  Cardiologist:   Skeet Latch, MD   Chief Complaint  Patient presents with  . New Patient (Initial Visit)    Pt states no Sx.       History of Present Illness: George Patton is a 45 y.o. male with hypertension, diabetes and asthma who presents for an evaluation of chest pain.  Three or four months ago he experienced some episodes of chest discomofort.  The episodes occurred 3 times per week and he described them as dull chest pain in the center of his chest that lasted for a few minutes at a time. There is no associated shortness of breath, nausea, or diaphoresis. It typically occurred while at work and he thinks that it may have been related to stress. He has not noted any exertional chest pain or pressure. It occurred in May or June and he has not experienced any recurrent symptoms since that time. He reported these symptoms to his PCP, Dr. Lamar Patton, who recommended that he be seen by cardiology. Two weeks ago George Patton decided to started to start exercising.  He has been going to the gym lifting weights and walking on the treadmill for an hour at a time. He denies any chest pain or shortness of breath with this activity. He has not noted any lower extremity edema, orthopnea, or PND. He denies palpitations, lightheadedness, or dizziness.  George Patton quit smoking  two years ago. Prior to that he smokes 3 or 4 cigarettes daily. He has not made any changes to his diet and notes that he eat more salt than he should.     Past Medical History:  Diagnosis Date  . Allergy   . Asthma   . Diabetes mellitus without complication ([place])   . Hypertension     Past Surgical History:  Procedure Laterality Date  . HERNIA REPAIR    . VASECTOMY       Current Outpatient Prescriptions  Medication Sig Dispense Refill  . albuterol (PROVENTIL HFA;VENTOLIN HFA) 108 (90  BASE) MCG/ACT inhaler Inhale 2 puffs into the lungs every 6 (six) hours as needed for wheezing or shortness of breath. 1 Inhaler 2  . amLODipine (NORVASC) 10 MG tablet Take 1 tablet (10 mg total) by mouth daily. 90 tablet 3  . EPINEPHrine (EPIPEN 2-PAK) 0.3 mg/0.3 mL IJ SOAJ injection Inject 0.3 mLs (0.3 mg total) into the muscle once. Repeat if needed 2 Device 1  . lisinopril-hydrochlorothiazide (PRINZIDE,ZESTORETIC) 20-12.5 MG per tablet Take 2 tablets by mouth daily. 180 tablet 3  . metFORMIN (GLUCOPHAGE) 500 MG tablet Take one pill at bedtime.  Increase to twice a day as directed 180 tablet 3  . methocarbamol (ROBAXIN) 500 MG tablet Take 1 tablet (500 mg total) by mouth every 6 (six) hours as needed for muscle spasms. 30 tablet 0   No current facility-administered medications for this visit.     Allergies:   Shellfish allergy    Social History:  The patient  reports that he quit smoking about 2 years ago. His smoking use included Cigarettes. He smoked 0.25 packs per day. He does not have any smokeless tobacco history on file. He reports that he drinks about 0.6 oz of alcohol per week . He reports that he does not use drugs.   Family History:  The patient's family history includes Asthma in  his mother and son; Cancer in his father; Heart attack in his maternal grandfather; Hypertension in his mother; Migraines in his sister; Mitral valve prolapse in his mother; Stroke in his maternal grandmother.    ROS:  Please see the history of present illness.   Otherwise, review of systems are positive for none.   All other systems are reviewed and negative.    PHYSICAL EXAM: VS:  BP (!) 152/88   Pulse 93   Ht 6\' 4"  (1.93 m)   Wt (!) 372 lb 9.6 oz (169 kg)   BMI 45.35 kg/m  , BMI Body mass index is 45.35 kg/m. GENERAL:  Well appearing HEENT:  Pupils equal round and reactive, fundi not visualized, oral mucosa unremarkable NECK:  No jugular venous distention, waveform within normal limits,  carotid upstroke brisk and symmetric, no bruits, no thyromegaly LYMPHATICS:  No cervical adenopathy LUNGS:  Clear to auscultation bilaterally HEART:  RRR.  PMI not displaced or sustained,S1 and S2 within normal limits, no S3, no S4, no clicks, no rubs, no murmurs ABD:  Flat, positive bowel sounds normal in frequency in pitch, no bruits, no rebound, no guarding, no midline pulsatile mass, no hepatomegaly, no splenomegaly EXT:  2 plus pulses throughout, no edema, no cyanosis no clubbing SKIN:  No rashes no nodules NEURO:  Cranial nerves II through XII grossly intact, motor grossly intact throughout PSYCH:  Cognitively intact, oriented to person place and time    EKG:  EKG is ordered today. The ekg ordered today demonstrates sinus rhythm.  Rate 93 bpm.  Prior inferior infarct   Recent Labs: 06/20/2015: Hemoglobin 14.6; Platelets 255.0 09/19/2015: ALT 46; BUN 17; Creatinine, Ser 1.30; Potassium 3.6; Sodium 140    Lipid Panel    Component Value Date/Time   CHOL 189 06/20/2015 1045   TRIG 138.0 06/20/2015 1045   HDL 40.90 06/20/2015 1045   CHOLHDL 5 06/20/2015 1045   VLDL 27.6 06/20/2015 1045   LDLCALC 121 (H) 06/20/2015 1045      Wt Readings from Last 3 Encounters:  10/04/15 (!) 372 lb 9.6 oz (169 kg)  09/19/15 (!) 375 lb 9.6 oz (170.4 kg)  06/20/15 (!) 369 lb 3.2 oz (167.5 kg)      ASSESSMENT AND PLAN:  # Hypertension:  Blood pressure is elevated today and has been elevated with his primary care physician in the past. He notes that it is lower now than it has been recently. He attributes this to starting to exercise. Before titrating his medications were starting any new medicines he wants to see what effect diet and exercise will have his blood pressure.  # Hyperlipidemia: cholesterol levels are elevated. His PCP has talked with him about starting a statin. His ASCVD 10 year risk of 13%. We discussed the fact that this would warrant the use of both aspirin and a statin.  However, he is reluctant to start any new medications as he thinks this may be would need to be on it for a lifetime. We talked about the importance of weight loss and dietary changes. If he admits to bee stings he possibly won't be diabetic, which would significantly affect his ASCVD 10 year risk. He plans to work hard on these changes and we will repeat his lipids in 6 months  # Atypcial CP: Symptoms were atypical and has not recurred he started his exercise regimen. It occurred during a stressful time work environment. Given that he does not have exertional symptoms and has not had any recurrent  chest discomfort and months, ischemia testing is not indicated at this time.    Current medicines are reviewed at length with the patient today.  The patient does not have concerns regarding medicines.  The following changes have been made:  no change  Labs/ tests ordered today include:   Orders Placed This Encounter  Procedures  . Lipid panel  . Comprehensive metabolic panel  . EKG 12-Lead     Disposition:   FU with Tiffany C. Oval Linsey, MD, [facility] in 6 months    This note was written with the assistance of speech recognition software.  Please excuse any transcriptional errors.  Signed, Tiffany C. Oval Linsey, MD, [facility]  10/04/2015 1:11 PM    [place]

## 2015-10-08 ENCOUNTER — Other Ambulatory Visit: Payer: Self-pay | Admitting: Emergency Medicine

## 2015-10-08 ENCOUNTER — Other Ambulatory Visit: Payer: Self-pay | Admitting: Family Medicine

## 2015-10-08 DIAGNOSIS — I10 Essential (primary) hypertension: Secondary | ICD-10-CM

## 2015-10-09 ENCOUNTER — Other Ambulatory Visit: Payer: Self-pay | Admitting: Emergency Medicine

## 2015-10-09 DIAGNOSIS — I10 Essential (primary) hypertension: Secondary | ICD-10-CM

## 2015-10-09 MED ORDER — LISINOPRIL-HYDROCHLOROTHIAZIDE 20-12.5 MG PO TABS: 2.0000 | ORAL_TABLET | Freq: Every day | ORAL | 3 refills | Status: DC

## 2015-11-04 ENCOUNTER — Other Ambulatory Visit: Payer: Self-pay | Admitting: Family Medicine

## 2015-11-04 DIAGNOSIS — I10 Essential (primary) hypertension: Secondary | ICD-10-CM

## 2016-01-31 ENCOUNTER — Encounter: Payer: Self-pay | Admitting: Family Medicine

## 2016-01-31 LAB — HM DIABETES EYE EXAM

## 2016-06-09 ENCOUNTER — Other Ambulatory Visit: Payer: Self-pay | Admitting: Family Medicine

## 2016-06-09 ENCOUNTER — Ambulatory Visit (INDEPENDENT_AMBULATORY_CARE_PROVIDER_SITE_OTHER): Payer: 59 | Admitting: Family Medicine

## 2016-06-09 ENCOUNTER — Encounter: Payer: Self-pay | Admitting: Family Medicine

## 2016-06-09 VITALS — BP 132/88 | HR 78 | Temp 98.1°F | Ht 76.0 in | Wt 354.2 lb

## 2016-06-09 DIAGNOSIS — E119 Type 2 diabetes mellitus without complications: Secondary | ICD-10-CM

## 2016-06-09 DIAGNOSIS — R634 Abnormal weight loss: Secondary | ICD-10-CM | POA: Diagnosis not present

## 2016-06-09 DIAGNOSIS — E118 Type 2 diabetes mellitus with unspecified complications: Secondary | ICD-10-CM

## 2016-06-09 DIAGNOSIS — I10 Essential (primary) hypertension: Secondary | ICD-10-CM

## 2016-06-09 DIAGNOSIS — Z5181 Encounter for therapeutic drug level monitoring: Secondary | ICD-10-CM

## 2016-06-09 LAB — HEMOGLOBIN A1C: Hgb A1c MFr Bld: 7.6 % — ABNORMAL HIGH (ref 4.6–6.5)

## 2016-06-09 LAB — COMPREHENSIVE METABOLIC PANEL
ALT: 56 U/L — ABNORMAL HIGH (ref 0–53)
AST: 33 U/L (ref 0–37)
Albumin: 4.4 g/dL (ref 3.5–5.2)
Alkaline Phosphatase: 63 U/L (ref 39–117)
BUN: 22 mg/dL (ref 6–23)
CO2: 29 mEq/L (ref 19–32)
Calcium: 9.7 mg/dL (ref 8.4–10.5)
Chloride: 105 mEq/L (ref 96–112)
Creatinine, Ser: 1.54 mg/dL — ABNORMAL HIGH (ref 0.40–1.50)
GFR: 62.6 mL/min (ref >60.00)
Glucose, Bld: 103 mg/dL — ABNORMAL HIGH (ref 70–99)
Potassium: 3.5 mEq/L (ref 3.5–5.1)
Sodium: 141 mEq/L (ref 135–145)
Total Bilirubin: 0.5 mg/dL (ref 0.2–1.2)
Total Protein: 7.2 g/dL (ref 6.0–8.3)

## 2016-06-09 LAB — LIPID PANEL
Cholesterol: 136 mg/dL (ref 0–200)
HDL: 38 mg/dL — ABNORMAL LOW (ref >39.00)
LDL Cholesterol: 75 mg/dL (ref 0–99)
NonHDL: 97.61
Total CHOL/HDL Ratio: 4
Triglycerides: 115 mg/dL (ref 0.0–149.0)
VLDL: 23 mg/dL (ref 0.0–40.0)

## 2016-06-09 NOTE — Patient Instructions (Signed)
Congrats on your weight loss!  Excellent work so far, keep it up. I will be in touch with your labs and we can plan your next visit- likely in 6 months Try cutting your amlodipine down to 5 mg and let me know how your BP responds  Have a wonderful summer

## 2016-06-09 NOTE — Progress Notes (Signed)
[place] at [facility] [address], [place], [place] [address] [phone]  Date:  06/09/2016   Name:  George Patton   DOB:  1970/11/07   MRN:  [id number]  PCP:  Darreld Mclean, MD    Chief Complaint: Weight Loss (Pt here to discuss the weight that he has lost. And to discuss current meds and have lab work done today. )   History of Present Illness:  George Patton is a 46 y.o. very pleasant male patient who presents with the following:  history of DM, HTN, OSA, obesity Last seen by myself in September of last year He needs an A1c today  Lab Results  Component Value Date   HGBA1C 7.5 (H) 09/19/2015   He is feeling well, has lost weight!    BP Readings from Last 3 Encounters:  06/09/16 132/88  10/04/15 (!) 152/88  09/19/15 (!) 162/95   Wt Readings from Last 3 Encounters:  06/09/16 (!) 354 lb 3.2 oz (160.7 kg)  10/04/15 (!) 372 lb 9.6 oz (169 kg)  09/19/15 (!) 375 lb 9.6 oz (170.4 kg)   He has lost about 30 lbs in the last couple of months He has been working on eating better- he is eating "clean," trying to follow a low fat diet He does check his glucose on occasion- fasting morning reading may be about 130 He is taking metfomrin 500 BID  He has noted some occasions of his BP feeling too low for about the last month; he felt lightheaded a couple of times and the top number was in the 90s. He will notice this more when the weather is very hot, and  might notice this more during the middle of the day He last ate about 5 hours ago  His family his doing well He is a former smoker  Patient Active Problem List   Diagnosis Date Noted  . Type II or unspecified type diabetes mellitus without mention of complication, not stated as uncontrolled 05/16/2013  . OSA (obstructive sleep apnea) 01/24/2013  . Obesity, unspecified 01/24/2013  . Hypertension 02/12/2011    Past Medical History:  Diagnosis Date  . Allergy   .  Asthma   . Diabetes mellitus without complication ([place])   . Hypertension     Past Surgical History:  Procedure Laterality Date  . HERNIA REPAIR    . VASECTOMY      Social History  Substance Use Topics  . Smoking status: Former Smoker    Packs/day: 0.25    Types: Cigarettes    Quit date: 08/06/2013  . Smokeless tobacco: Not on file  . Alcohol use 0.6 oz/week    1 Shots of liquor per week     Comment: Once a month     Family History  Problem Relation Age of Onset  . Asthma Mother   . Mitral valve prolapse Mother   . Hypertension Mother   . Migraines Sister   . Asthma Son   . Cancer Father   . Stroke Maternal Grandmother   . Heart attack Maternal Grandfather     Allergies  Allergen Reactions  . Shellfish Allergy Anaphylaxis    Medication list has been reviewed and updated.  Current Outpatient Prescriptions on File Prior to Visit  Medication Sig Dispense Refill  . albuterol (PROVENTIL HFA;VENTOLIN HFA) 108 (90 BASE) MCG/ACT inhaler Inhale 2 puffs into the lungs every 6 (six) hours as needed for wheezing or  shortness of breath. 1 Inhaler 2  . amLODipine (NORVASC) 10 MG tablet TAKE ONE TABLET BY MOUTH ONCE DAILY 30 tablet 11  . EPINEPHrine (EPIPEN 2-PAK) 0.3 mg/0.3 mL IJ SOAJ injection Inject 0.3 mLs (0.3 mg total) into the muscle once. Repeat if needed 2 Device 1  . lisinopril-hydrochlorothiazide (PRINZIDE,ZESTORETIC) 20-12.5 MG tablet Take 2 tablets by mouth daily. 180 tablet 3  . metFORMIN (GLUCOPHAGE) 500 MG tablet Take one pill at bedtime.  Increase to twice a day as directed 180 tablet 3   No current facility-administered medications on file prior to visit.     Review of Systems:  As per HPI- otherwise negative. No fever, chills, CP, SOB   Physical Examination: Vitals:   06/09/16 1311  BP: 132/88  Pulse: 78  Temp: 98.1 F (36.7 C)   Vitals:   06/09/16 1311  Weight: (!) 354 lb 3.2 oz (160.7 kg)  Height: 6\' 4"  (1.93 m)   Body mass index is 43.11  kg/m. Ideal Body Weight: Weight in (lb) to have BMI = 25: 205  GEN: WDWN, NAD, Non-toxic, A & O x 3 HEENT: Atraumatic, Normocephalic. Neck supple. No masses, No LAD. Ears and Nose: No external deformity. CV: RRR, No M/G/R. No JVD. No thrill. No extra heart sounds. PULM: CTA B, no wheezes, crackles, rhonchi. No retractions. No resp. distress. No accessory muscle use. ABD: S, NT, ND, +BS. No rebound. No HSM. EXTR: No c/c/e NEURO Normal gait.  PSYCH: Normally interactive. Conversant. Not depressed or anxious appearing.  Calm demeanor.  Obese but has lost, looks well today   Assessment and Plan: Medication monitoring encounter - Plan: Hemoglobin A1c, Lipid panel, Comprehensive metabolic panel  Controlled type 2 diabetes mellitus without complication, without long-term current use of insulin (HCC) - Plan: Hemoglobin A1c, Lipid panel, Comprehensive metabolic panel  Essential hypertension  Weight loss  Here today for a recheck visit He has lost weight- praised his efforts Labs today BP is normal and he reports some incidents of apparent hypotension- will decrease his amlodipine to 5 mg and he will continue to monitor  Signed Lamar Blinks, MD

## 2016-07-15 NOTE — Progress Notes (Signed)
[place] at [facility] [address], [place], [place] [address] [phone]  Date:  07/16/2016   Name:  George Patton   DOB:  02/09/1970   MRN:  [id number]  PCP:  Darreld Mclean, MD    Chief Complaint: Follow-up (Pt here for f/u visit and labs. )   History of Present Illness:  George Patton is a 46 y.o. very pleasant male patient who presents with the following:  History of obesity, DM, HTN, OSA Here today for follow-up Last seen by myself about a month ago- at that time he had lose some weight and we decreased his dose of amlodipine as his BP was borderline low.  However he did have some mild lab abnl:   Your A1c is about the same, but don't be discouraged! I think if you continue your weight loss efforts we will see this number go down  Your total cholesterol is under good control. I would like to see your HDL higher however; please add an omega 3 supplement to your daily routine. We could also consider using a cholesterol medication to boost your HDL- what would you prefer to do in this case?  Metabolic profile looks ok; your liver and kidney labs are minimally elevated today (ALT and Creatinine). I am not overly concerned about these numbers, but would like to have you come in for a lab visit only to recheck in about one month. Otherwise we can plan to visit in 6 months.   He would like to recheck these labs today.  [name] had thought that he needed a lab visit only, and this was actually what I had planned for him as well.  He may have been scheduled by mistake. He does not need anything further today.  Will get his labs and no change for visit   BP Readings from Last 3 Encounters:  07/16/16 (!) 142/92  06/09/16 132/88  10/04/15 (!) 152/88   Lab Results  Component Value Date   HGBA1C 7.6 (H) 06/09/2016   Wt Readings from Last 3 Encounters:  07/16/16 (!) 350 lb 6.4 oz (158.9 kg)  06/09/16 (!) 354 lb 3.2 oz (160.7 kg)   10/04/15 (!) 372 lb 9.6 oz (169 kg)   He continues to lose a bit of weight!     Patient Active Problem List   Diagnosis Date Noted  . Type II or unspecified type diabetes mellitus without mention of complication, not stated as uncontrolled 05/16/2013  . OSA (obstructive sleep apnea) 01/24/2013  . Obesity, unspecified 01/24/2013  . Hypertension 02/12/2011    Past Medical History:  Diagnosis Date  . Allergy   . Asthma   . Diabetes mellitus without complication ([place])   . Hypertension     Past Surgical History:  Procedure Laterality Date  . HERNIA REPAIR    . VASECTOMY      Social History  Substance Use Topics  . Smoking status: Former Smoker    Packs/day: 0.25    Types: Cigarettes    Quit date: 08/06/2013  . Smokeless tobacco: Not on file  . Alcohol use 0.6 oz/week    1 Shots of liquor per week     Comment: Once a month     Family History  Problem Relation Age of Onset  . Asthma Mother   . Mitral valve prolapse Mother   . Hypertension Mother   . Migraines Sister   . Asthma Son   .  Cancer Father   . Stroke Maternal Grandmother   . Heart attack Maternal Grandfather     Allergies  Allergen Reactions  . Shellfish Allergy Anaphylaxis    Medication list has been reviewed and updated.  Current Outpatient Prescriptions on File Prior to Visit  Medication Sig Dispense Refill  . albuterol (PROVENTIL HFA;VENTOLIN HFA) 108 (90 BASE) MCG/ACT inhaler Inhale 2 puffs into the lungs every 6 (six) hours as needed for wheezing or shortness of breath. 1 Inhaler 2  . amLODipine (NORVASC) 10 MG tablet TAKE ONE TABLET BY MOUTH ONCE DAILY 30 tablet 11  . EPINEPHrine (EPIPEN 2-PAK) 0.3 mg/0.3 mL IJ SOAJ injection Inject 0.3 mLs (0.3 mg total) into the muscle once. Repeat if needed 2 Device 1  . lisinopril-hydrochlorothiazide (PRINZIDE,ZESTORETIC) 20-12.5 MG tablet Take 2 tablets by mouth daily. 180 tablet 3  . metFORMIN (GLUCOPHAGE) 500 MG tablet TAKE ONE TABLET BY MOUTH AT BEDTIME  INCREASE TO TWICE DAILY AS DIRECTED 60 tablet 11   No current facility-administered medications on file prior to visit.     Review of Systems:  As per HPI- otherwise negative.   Physical Examination: Vitals:   07/16/16 1003  BP: (!) 142/92  Pulse: 71  Temp: 98 F (36.7 C)   Vitals:   07/16/16 1003  Weight: (!) 350 lb 6.4 oz (158.9 kg)  Height: 6\' 4"  (1.93 m)   Body mass index is 42.65 kg/m. Ideal Body Weight: Weight in (lb) to have BMI = 25: 205   GEN: WDWN, NAD, Non-toxic, Alert & Oriented x 3 HEENT: Atraumatic, Normocephalic.  Ears and Nose: No external deformity. EXTR: No clubbing/cyanosis/edema NEURO: Normal gait.  PSYCH: Normally interactive. Conversant. Not depressed or anxious appearing.  Calm demeanor.  Looks well, obese/ tall build  Assessment and Plan: Transaminitis - Plan: Comprehensive metabolic panel  Elevated serum creatinine - Plan: Comprehensive metabolic panel  Lab recheck today Pt put on schedule by mistake, just needed labs.  Will no charge viist   Signed Lamar Blinks, MD

## 2016-07-16 ENCOUNTER — Ambulatory Visit (INDEPENDENT_AMBULATORY_CARE_PROVIDER_SITE_OTHER): Payer: 59 | Admitting: Family Medicine

## 2016-07-16 VITALS — BP 142/92 | HR 71 | Temp 98.0°F | Ht 76.0 in | Wt 350.4 lb

## 2016-07-16 DIAGNOSIS — R740 Nonspecific elevation of levels of transaminase and lactic acid dehydrogenase [LDH]: Secondary | ICD-10-CM

## 2016-07-16 DIAGNOSIS — R7401 Elevation of levels of liver transaminase levels: Secondary | ICD-10-CM

## 2016-07-16 DIAGNOSIS — R7989 Other specified abnormal findings of blood chemistry: Secondary | ICD-10-CM

## 2016-07-16 LAB — COMPREHENSIVE METABOLIC PANEL
ALT: 36 U/L (ref 0–53)
AST: 25 U/L (ref 0–37)
Albumin: 4.2 g/dL (ref 3.5–5.2)
Alkaline Phosphatase: 65 U/L (ref 39–117)
BUN: 17 mg/dL (ref 6–23)
CO2: 28 mEq/L (ref 19–32)
Calcium: 9.4 mg/dL (ref 8.4–10.5)
Chloride: 104 mEq/L (ref 96–112)
Creatinine, Ser: 1.31 mg/dL (ref 0.40–1.50)
GFR: 75.41 mL/min (ref >60.00)
Glucose, Bld: 114 mg/dL — ABNORMAL HIGH (ref 70–99)
Potassium: 3.5 mEq/L (ref 3.5–5.1)
Sodium: 139 mEq/L (ref 135–145)
Total Bilirubin: 0.6 mg/dL (ref 0.2–1.2)
Total Protein: 7.2 g/dL (ref 6.0–8.3)

## 2016-10-03 ENCOUNTER — Encounter: Payer: Self-pay | Admitting: Family Medicine

## 2016-10-03 DIAGNOSIS — N5089 Other specified disorders of the male genital organs: Secondary | ICD-10-CM

## 2016-10-21 ENCOUNTER — Other Ambulatory Visit: Payer: Self-pay | Admitting: Family Medicine

## 2016-10-21 DIAGNOSIS — I10 Essential (primary) hypertension: Secondary | ICD-10-CM

## 2016-12-26 ENCOUNTER — Other Ambulatory Visit: Payer: Self-pay | Admitting: Family Medicine

## 2016-12-26 DIAGNOSIS — I10 Essential (primary) hypertension: Secondary | ICD-10-CM

## 2017-01-08 ENCOUNTER — Encounter: Payer: Self-pay | Admitting: Family Medicine

## 2017-06-19 ENCOUNTER — Encounter: Payer: Self-pay | Admitting: Family Medicine

## 2017-06-20 ENCOUNTER — Encounter (INDEPENDENT_AMBULATORY_CARE_PROVIDER_SITE_OTHER): Payer: Self-pay

## 2017-07-03 ENCOUNTER — Other Ambulatory Visit: Payer: Self-pay | Admitting: Family Medicine

## 2017-07-03 DIAGNOSIS — I10 Essential (primary) hypertension: Secondary | ICD-10-CM

## 2017-07-03 DIAGNOSIS — E118 Type 2 diabetes mellitus with unspecified complications: Secondary | ICD-10-CM

## 2017-08-05 ENCOUNTER — Encounter: Payer: 59 | Admitting: Family Medicine

## 2017-12-21 ENCOUNTER — Other Ambulatory Visit: Payer: Self-pay | Admitting: Family Medicine

## 2017-12-21 DIAGNOSIS — I10 Essential (primary) hypertension: Secondary | ICD-10-CM

## 2018-03-08 ENCOUNTER — Other Ambulatory Visit (HOSPITAL_COMMUNITY): Payer: Self-pay | Admitting: Family Medicine

## 2018-03-08 DIAGNOSIS — I10 Essential (primary) hypertension: Secondary | ICD-10-CM

## 2018-03-11 ENCOUNTER — Ambulatory Visit (HOSPITAL_COMMUNITY)
Admission: RE | Admit: 2018-03-11 | Discharge: 2018-03-11 | Disposition: A | Discharge status: Home / Self Care | Payer: 59 | Source: Ambulatory Visit | Attending: Internal Medicine | Admitting: Internal Medicine

## 2018-03-11 DIAGNOSIS — I10 Essential (primary) hypertension: Secondary | ICD-10-CM | POA: Insufficient documentation

## 2019-03-21 ENCOUNTER — Other Ambulatory Visit: Payer: Self-pay | Admitting: Surgery

## 2019-03-21 ENCOUNTER — Other Ambulatory Visit (HOSPITAL_COMMUNITY): Payer: Self-pay | Admitting: Surgery

## 2019-04-01 ENCOUNTER — Ambulatory Visit (HOSPITAL_COMMUNITY)
Admission: RE | Admit: 2019-04-01 | Discharge: 2019-04-01 | Disposition: A | Discharge status: Home / Self Care | Payer: 59 | Source: Ambulatory Visit | Attending: Surgery | Admitting: Surgery

## 2019-04-01 ENCOUNTER — Other Ambulatory Visit: Payer: Self-pay

## 2019-04-21 ENCOUNTER — Encounter: Payer: 59 | Attending: Surgery | Admitting: Dietician

## 2019-04-21 ENCOUNTER — Encounter: Payer: Self-pay | Admitting: Dietician

## 2019-04-21 ENCOUNTER — Other Ambulatory Visit: Payer: Self-pay

## 2019-04-21 DIAGNOSIS — E669 Obesity, unspecified: Secondary | ICD-10-CM | POA: Insufficient documentation

## 2019-04-21 NOTE — Progress Notes (Signed)
Nutrition Assessment for Bariatric Surgery Medical Nutrition Therapy   Patient was seen on 04/21/2019 for Pre-Operative Nutrition Assessment. Letter of approval faxed to [facility] bariatric surgery program coordinator on 04/21/2019.   Referral stated Supervised Weight Loss (SWL) visits needed: 6  Planned surgery: Sleeve Gastrectomy  Pt expectation of surgery: to come off medications, to be around longer for kids    NUTRITION ASSESSMENT   Anthropometrics  Start weight at [facility]: 387.7 lbs (date: 04/21/2019) Height: 75.5 in BMI: 47.8 kg/m2     Lifestyle & Dietary Hx Patient does the food shopping and cooking at home for his wife and kids. Typically skips breakfast, but may stop and get a biscuit. Wife is gluten-free, one of his children is vegetarian. Allergic to shellfish and kiwi.   24-Hr Dietary Recall First Meal: skips Snack: - Second Meal: [employer], [employer], Asian Snack: - Third Meal: meat + vegetable + starch Snack: ice cream Beverages: water, occasionally juice    NUTRITION DIAGNOSIS  Overweight/obesity ([place]-3.3) related to past poor dietary habits and physical inactivity as evidenced by patient w/ planned Sleeve Gastrectomy surgery following dietary guidelines for continued weight loss.    NUTRITION INTERVENTION  Nutrition counseling (C-1) and education (E-2) to facilitate bariatric surgery goals.  Pre-Op Goals Reviewed with the Patient . Track food and beverage intake (pen and paper, MyFitness Pal, Baritastic app, etc.) . Make healthy food choices while monitoring portion sizes . Consume 3 meals per day or try to eat every 3-5 hours . Avoid concentrated sugars and fried foods . Keep sugar & fat in the single digits per serving on food labels . Practice CHEWING your food (aim for applesauce consistency) . Practice not drinking 15 minutes before, during, and 30 minutes after each meal and snack . Avoid all carbonated beverages (ex: soda, sparkling  beverages)  . Limit caffeinated beverages (ex: coffee, tea, energy drinks) . Avoid all sugar-sweetened beverages (ex: regular soda, sports drinks)  . Avoid alcohol  . Aim for 64-100 ounces of FLUID daily (with at least half of fluid intake being plain water)  . Aim for at least 60-80 grams of PROTEIN daily . Look for a liquid protein source that contains ?15 g protein and ?5 g carbohydrate (ex: shakes, drinks, shots) . Make a list of non-food related activities . Physical activity is an important part of a healthy lifestyle so keep it moving! The goal is to reach 150 minutes of exercise per week, including cardiovascular and weight baring activity.  *Goals that are bolded indicate the pt would like to start working towards these  Handouts Provided Include  . Bariatric Surgery handouts (Nutrition Visits, Pre-Op Goals, Protein Shakes, Vitamins & Minerals)  Learning Style & Readiness for Change Teaching method utilized: Visual & Auditory  Demonstrated degree of understanding via: Teach Back  Barriers to learning/adherence to lifestyle change: None Identified    MONITORING & EVALUATION Dietary intake, weekly physical activity, body weight, and pre-op goals reached at next nutrition visit.   Next Steps Patient is to return to [facility] in 1 month for 1st SWL Visit.

## 2019-04-21 NOTE — Patient Instructions (Addendum)
Begin working through the [employer] discussed today, starting with the following:  . Practice CHEWING your food (aim for applesauce consistency) . Aim for 64-100 ounces of FLUID daily (with at least half of fluid intake being plain water)

## 2019-05-19 ENCOUNTER — Other Ambulatory Visit: Payer: Self-pay

## 2019-05-19 ENCOUNTER — Encounter: Payer: 59 | Attending: Surgery | Admitting: Skilled Nursing Facility1

## 2019-05-19 DIAGNOSIS — E669 Obesity, unspecified: Secondary | ICD-10-CM

## 2019-05-19 NOTE — Progress Notes (Signed)
Supervised Weight Loss Visit Bariatric Nutrition Education  Planned Surgery: sleeve Pt Expectation of Surgery/ Goals: to come off meds  1 out of 6 SWL Appointments    NUTRITION ASSESSMENT  Anthropometrics  Start weight at [facility]: 387.7 lbs (date: 04/21/2019) Today's weight: 391.3 lbs Weight change: +4 lbs (since previous visit)  BMI: 48.26 kg/m2    Clinical  Medical Hx: OSA, DM Medications:  Labs:  Notable Signs/Symptoms:   Lifestyle & Dietary Hx  Pt states he feels he was always a through chewer but it was very difficult to to get in more fluid. Pt states he feels work gets in his way of being able top drink. Pt states he has found from chewing more he has been leaving   Estimated daily fluid intake: unknown oz Supplements:  Current average weekly physical activity: ADL's  24-Hr Dietary Recall First Meal: fast food Snack:  Second Meal: fast food Snack:  Third Meal: pork steak + baked potato and green beans Snack: ice cream or other dessert Beverages: coffee, water, sweet, liquor once a week (rum and coke)  Estimated Energy Needs Calories: 2000 Carbohydrate: 225g Protein: 150g Fat: 56g   NUTRITION DIAGNOSIS  Overweight/obesity ([place]-3.3) related to past poor dietary habits and physical inactivity as evidenced by patient w/ planned Sleeve surgery following dietary guidelines for continued weight loss.   NUTRITION INTERVENTION  Nutrition counseling (C-1) and education (E-2) to facilitate bariatric surgery goals.  Pre-Op Goals Progress & New Goals -Continue: Practice CHEWING your food (aim for applesauce consistency) -Continue: Aim for 64-100 ounces of FLUID daily (with at least half of fluid intake being plain water)  -NEW: buy a water bottle TODAY -NEW: Eat breakfast from home 2-3 times a week   Handouts Provided Include     Learning Style & Readiness for Change Teaching method utilized: Visual & Auditory  Demonstrated degree of understanding via: Teach  Back  Barriers to learning/adherence to lifestyle change: contemplative stage of change  RD's Notes for next Visit  Assess pts adherence to chosen goals   MONITORING & EVALUATION Dietary intake, weekly physical activity, body weight, and pre-op goals in 1 month.   Next Steps  Patient is to return to [facility]

## 2019-06-21 ENCOUNTER — Encounter: Payer: 59 | Attending: Surgery | Admitting: Skilled Nursing Facility1

## 2019-06-21 ENCOUNTER — Other Ambulatory Visit: Payer: Self-pay

## 2019-06-21 DIAGNOSIS — E669 Obesity, unspecified: Secondary | ICD-10-CM | POA: Insufficient documentation

## 2019-06-21 NOTE — Progress Notes (Signed)
Supervised Weight Loss Visit Bariatric Nutrition Education  Planned Surgery: sleeve Pt Expectation of Surgery/ Goals: to come off meds  2 out of 6 SWL Appointments    NUTRITION ASSESSMENT  Anthropometrics  Start weight at [facility]: 387.7 lbs (date: 04/21/2019) Today's weight: 391.3 lbs Weight change: -8 lbs (since previous visit)  BMI: 47.29 kg/m2    Clinical  Medical Hx: OSA, DM Medications:  Labs:  Notable Signs/Symptoms:   Lifestyle & Dietary Hx  Pt states he bought a 64 oz water bottle which has helped him increase his fluid. Pt states summer is very stressful waking to multiple emails. Pt states if he does not get to lunch by 2 he will not eat it. Pt states he is winning with being able to leave food on the plate.   Estimated daily fluid intake: 64+ oz Supplements:  Current average weekly physical activity: ADL's  24-Hr Dietary Recall First Meal: yogurt + 2 toast or fast food Snack:  Second Meal: fast food or skipped Snack:  Third Meal: pork steak + baked potato and green beans Snack: ice cream or other dessert Beverages: coffee, water, sweet, liquor once a week (rum and coke)  Estimated Energy Needs Calories: 2000 Carbohydrate: 225g Protein: 150g Fat: 56g   NUTRITION DIAGNOSIS  Overweight/obesity ([place]-3.3) related to past poor dietary habits and physical inactivity as evidenced by patient w/ planned Sleeve surgery following dietary guidelines for continued weight loss.   NUTRITION INTERVENTION  Nutrition counseling (C-1) and education (E-2) to facilitate bariatric surgery goals.  Pre-Op Goals Progress & New Goals -Continue: Practice CHEWING your food (aim for applesauce consistency) -Continue: Aim for 64-100 ounces of FLUID daily (with at least half of fluid intake being plain water)  -continue: buy a water bottle TODAY -continue: Eat breakfast from home 2-3 times a week -NEW: Carve out time for lunch -NEW: identify fullness verses satiety    Handouts  Provided Include     Learning Style & Readiness for Change Teaching method utilized: Visual & Auditory  Demonstrated degree of understanding via: Teach Back  Barriers to learning/adherence to lifestyle change: none identified   RD's Notes for next Visit  Assess pts adherence to chosen goals   MONITORING & EVALUATION Dietary intake, weekly physical activity, body weight, and pre-op goals in 1 month.   Next Steps  Patient is to return to [facility]

## 2019-07-05 ENCOUNTER — Ambulatory Visit (INDEPENDENT_AMBULATORY_CARE_PROVIDER_SITE_OTHER): Payer: 59 | Admitting: Psychology

## 2019-07-19 ENCOUNTER — Ambulatory Visit (INDEPENDENT_AMBULATORY_CARE_PROVIDER_SITE_OTHER): Payer: 59 | Admitting: Psychology

## 2019-07-28 ENCOUNTER — Other Ambulatory Visit: Payer: Self-pay

## 2019-07-28 ENCOUNTER — Encounter: Payer: 59 | Attending: Surgery | Admitting: Skilled Nursing Facility1

## 2019-07-28 DIAGNOSIS — E669 Obesity, unspecified: Secondary | ICD-10-CM | POA: Insufficient documentation

## 2019-07-28 NOTE — Progress Notes (Signed)
Supervised Weight Loss Visit Bariatric Nutrition Education  Planned Surgery: sleeve Pt Expectation of Surgery/ Goals: to come off meds  3 out of 6 SWL Appointments    NUTRITION ASSESSMENT  Anthropometrics  Start weight at [facility]: 387.7 lbs (date: 04/21/2019) Today's weight: 391.3 lbs Weight change: -8 lbs (since previous visit)  BMI: 47.29 kg/m2    Clinical  Medical Hx: OSA, DM Medications:  Labs:  Notable Signs/Symptoms:   Lifestyle & Dietary Hx  Pt states he bought a 64 oz water bottle which has helped him increase his fluid. Pt states summer is very stressful waking to multiple emails.  Pt states he is winning with being able to leave food on the plate.   Pt states he was able to carve out lunch sometimes but not all the time stating he does feel it will continue to get better.  Pt states he has been Seeing others relationships with food which has been an interesting experience.  Pt states he hates leftovers (dietitian discussed cooking meals he would like to have as leftovers for lunch)  Pt states his wife is not a good cook and he does all the grocery shopping.  Estimated daily fluid intake: 64+ oz Supplements:  Current average weekly physical activity: ADL's  24-Hr Dietary Recall First Meal: oatmeal + golden raisins + eggs + toast or bacon and eggs or waffles Snack:  Second Meal: selective leftovers or skipped Snack:  Third Meal: pork steak + baked potato and green beans Snack: ice cream or other dessert Beverages: coffee, water, sweet, liquor once a week (rum and coke)  Estimated Energy Needs Calories: 2000 Carbohydrate: 225g Protein: 150g Fat: 56g   NUTRITION DIAGNOSIS  Overweight/obesity ([place]-3.3) related to past poor dietary habits and physical inactivity as evidenced by patient w/ planned Sleeve surgery following dietary guidelines for continued weight loss.   NUTRITION INTERVENTION  Nutrition counseling (C-1) and education (E-2) to facilitate  bariatric surgery goals.  Pre-Op Goals Progress & New Goals -Continue: Practice CHEWING your food (aim for applesauce consistency) -Continue: Aim for 64-100 ounces of FLUID daily (with at least half of fluid intake being plain water)  -continue: buy a water bottle TODAY -continue: Eat breakfast from home 2-3 times a week -Continue: Carve out time for lunch (30% met): continue to not schedule meetings around lunch time -Continue: identify fullness verses satiety felt guilt at first  -Continue: being mindful and in the moment with meals and relationship with food  Handouts Provided Include   Mindful meals check list  Learning Style & Readiness for Change Teaching method utilized: Visual & Auditory  Demonstrated degree of understanding via: Teach Back  Barriers to learning/adherence to lifestyle change: none identified   RD's Notes for next Visit  Assess pts adherence to chosen goals   MONITORING & EVALUATION Dietary intake, weekly physical activity, body weight, and pre-op goals in 1 month.   Next Steps  Patient is to return to [facility]

## 2019-08-22 ENCOUNTER — Other Ambulatory Visit: Payer: Self-pay | Admitting: Gastroenterology

## 2019-08-31 ENCOUNTER — Encounter: Payer: 59 | Attending: Surgery | Admitting: Dietician

## 2019-08-31 ENCOUNTER — Other Ambulatory Visit: Payer: Self-pay

## 2019-08-31 ENCOUNTER — Encounter: Payer: Self-pay | Admitting: Dietician

## 2019-08-31 DIAGNOSIS — E669 Obesity, unspecified: Secondary | ICD-10-CM | POA: Insufficient documentation

## 2019-08-31 NOTE — Progress Notes (Signed)
Supervised Weight Loss Visit Bariatric Nutrition Education  Planned Surgery: Sleeve Pt Expectation of Surgery/ Goals: to come off meds  4 out of 6 SWL Appointments    NUTRITION ASSESSMENT  Anthropometrics  Start weight at [facility]: 387.7 lbs (date: 04/21/2019) Today's weight: 370.7 lbs BMI: 45.7 kg/m2    Clinical  Medical Hx: OSA, DM  Lifestyle & Dietary Hx Patient continues to do well with drinking lots of fluids throughout the day, states he primarily drinks water. States he is accomplished with chewing well. Patient does the cooking at home, and has been working on also preparing breakfasts and lunches at home rather than eating out. States he is looking forward to the fall when things should be less hectic at work. States he is more able to carve out lunch sometimes but not all the time stating he does feel it will continue to get better.   Estimated daily fluid intake: 64+ oz Current average weekly physical activity: ADL's; walking a lot at work, not sedentary throughout the day   24-Hr Dietary Recall First Meal: oatmeal + golden raisins + eggs (or yogurt)  Snack:  Second Meal: leftovers  Snack:  Third Meal: pork steak + baked potato + green beans Snack: ice cream or other dessert Beverages: coffee, water, sweet, liquor once a week (rum and coke)  Estimated Energy Needs Calories: 2000 Carbohydrate: 225g Protein: 150g Fat: 56g   NUTRITION DIAGNOSIS  Overweight/obesity ([place]-3.3) related to past poor dietary habits and physical inactivity as evidenced by patient w/ planned Sleeve surgery following dietary guidelines for continued weight loss.   NUTRITION INTERVENTION  Nutrition counseling (C-1) and education (E-2) to facilitate bariatric surgery goals.  Pre-Op Goals Progress & New Goals -Continue: Practice CHEWING your food (aim for applesauce consistency) -Continue: Aim for 64-100 ounces of FLUID daily (with at least half of fluid intake being plain water)  -Purchased  a water bottle  -Continue: Eat breakfast from home 2-3 times a week -Continue: Carve out time for lunch  -Continue: Identify fullness verses satiety -Continue: Being mindful and in the moment with meals and relationship with food  Handouts Provided Include   N/A  Learning Style & Readiness for Change Teaching method utilized: Visual & Auditory  Demonstrated degree of understanding via: Teach Back  Barriers to learning/adherence to lifestyle change: None Identified     MONITORING & EVALUATION Dietary intake, weekly physical activity, body weight, and pre-op goals in 1 month.   Next Steps  Patient is to return to [place] for 5th SWL visit in 1 month.

## 2019-09-02 ENCOUNTER — Other Ambulatory Visit: Payer: Self-pay

## 2019-09-02 ENCOUNTER — Encounter (HOSPITAL_COMMUNITY): Payer: Self-pay | Admitting: Gastroenterology

## 2019-09-06 ENCOUNTER — Other Ambulatory Visit (HOSPITAL_COMMUNITY)
Admission: RE | Admit: 2019-09-06 | Discharge: 2019-09-06 | Disposition: A | Discharge status: Home / Self Care | Payer: 59 | Source: Ambulatory Visit | Attending: Gastroenterology | Admitting: Gastroenterology

## 2019-09-06 DIAGNOSIS — Z20822 Contact with and (suspected) exposure to covid-19: Secondary | ICD-10-CM | POA: Insufficient documentation

## 2019-09-06 DIAGNOSIS — Z01812 Encounter for preprocedural laboratory examination: Secondary | ICD-10-CM | POA: Diagnosis present

## 2019-09-06 LAB — SARS CORONAVIRUS 2 (TAT 6-24 HRS): SARS Coronavirus 2: NEGATIVE

## 2019-09-08 NOTE — Anesthesia Preprocedure Evaluation (Addendum)
Anesthesia Evaluation  Patient identified by MRN, date of birth, ID band Patient awake    Reviewed: Allergy & Precautions, H&P , NPO status , Patient's Chart, lab work & pertinent test results  Airway Mallampati: I  TM Distance: >3 FB Neck ROM: Full    Dental no notable dental hx. (+) Teeth Intact, Dental Advisory Given   Pulmonary neg pulmonary ROS, asthma , sleep apnea , former smoker,    Pulmonary exam normal breath sounds clear to auscultation       Cardiovascular Exercise Tolerance: Good hypertension, Pt. on medications negative cardio ROS Normal cardiovascular exam Rhythm:Regular Rate:Normal     Neuro/Psych negative neurological ROS  negative psych ROS   GI/Hepatic negative GI ROS, Neg liver ROS,   Endo/Other  negative endocrine ROSdiabetes, Type 2, Oral Hypoglycemic AgentsMorbid obesity  Renal/GU negative Renal ROS  negative genitourinary   Musculoskeletal negative musculoskeletal ROS (+)   Abdominal   Peds negative pediatric ROS (+)  Hematology negative hematology ROS (+)   Anesthesia Other Findings   Reproductive/Obstetrics negative OB ROS                            Anesthesia Physical Anesthesia Plan  ASA: III  Anesthesia Plan: MAC   Post-op Pain Management:    Induction: Intravenous  PONV Risk Score and Plan: 1 and Propofol infusion  Airway Management Planned:   Additional Equipment:   Intra-op Plan:   Post-operative Plan:   Informed Consent: I have reviewed the patients History and Physical, chart, labs and discussed the procedure including the risks, benefits and alternatives for the proposed anesthesia with the patient or authorized representative who has indicated his/her understanding and acceptance.       Plan Discussed with: Anesthesiologist and CRNA  Anesthesia Plan Comments:       Anesthesia Quick Evaluation

## 2019-09-09 ENCOUNTER — Encounter (HOSPITAL_COMMUNITY)
Admission: RE | Disposition: A | Discharge status: Home / Self Care | Payer: Self-pay | Source: Home / Self Care | Attending: Gastroenterology

## 2019-09-09 ENCOUNTER — Ambulatory Visit (HOSPITAL_COMMUNITY): Payer: 59 | Admitting: Anesthesiology

## 2019-09-09 ENCOUNTER — Other Ambulatory Visit: Payer: Self-pay

## 2019-09-09 ENCOUNTER — Ambulatory Visit (HOSPITAL_COMMUNITY)
Admission: RE | Admit: 2019-09-09 | Discharge: 2019-09-09 | Disposition: A | Discharge status: Home / Self Care | Payer: 59 | Attending: Gastroenterology | Admitting: Gastroenterology

## 2019-09-09 DIAGNOSIS — Z7984 Long term (current) use of oral hypoglycemic drugs: Secondary | ICD-10-CM | POA: Insufficient documentation

## 2019-09-09 DIAGNOSIS — D124 Benign neoplasm of descending colon: Secondary | ICD-10-CM | POA: Insufficient documentation

## 2019-09-09 DIAGNOSIS — I10 Essential (primary) hypertension: Secondary | ICD-10-CM | POA: Diagnosis not present

## 2019-09-09 DIAGNOSIS — Z1211 Encounter for screening for malignant neoplasm of colon: Secondary | ICD-10-CM | POA: Insufficient documentation

## 2019-09-09 DIAGNOSIS — G4733 Obstructive sleep apnea (adult) (pediatric): Secondary | ICD-10-CM | POA: Insufficient documentation

## 2019-09-09 DIAGNOSIS — Z6841 Body Mass Index (BMI) 40.0 and over, adult: Secondary | ICD-10-CM | POA: Insufficient documentation

## 2019-09-09 DIAGNOSIS — D123 Benign neoplasm of transverse colon: Secondary | ICD-10-CM | POA: Diagnosis not present

## 2019-09-09 DIAGNOSIS — J45909 Unspecified asthma, uncomplicated: Secondary | ICD-10-CM | POA: Diagnosis not present

## 2019-09-09 DIAGNOSIS — Z87891 Personal history of nicotine dependence: Secondary | ICD-10-CM | POA: Diagnosis not present

## 2019-09-09 DIAGNOSIS — D128 Benign neoplasm of rectum: Secondary | ICD-10-CM | POA: Insufficient documentation

## 2019-09-09 DIAGNOSIS — E119 Type 2 diabetes mellitus without complications: Secondary | ICD-10-CM | POA: Diagnosis not present

## 2019-09-09 DIAGNOSIS — Z800 Family history of malignant neoplasm of digestive organs: Secondary | ICD-10-CM | POA: Diagnosis not present

## 2019-09-09 HISTORY — DX: Obstructive sleep apnea (adult) (pediatric): G47.33

## 2019-09-09 HISTORY — PX: HEMOSTASIS CLIP PLACEMENT: SHX6857

## 2019-09-09 HISTORY — PX: POLYPECTOMY: SHX5525

## 2019-09-09 HISTORY — DX: Dependence on other enabling machines and devices: Z99.89

## 2019-09-09 HISTORY — PX: COLONOSCOPY WITH PROPOFOL: SHX5780

## 2019-09-09 LAB — POCT I-STAT, CHEM 8
BUN: 11 mg/dL (ref 6–20)
Calcium, Ion: 1.15 mmol/L (ref 1.15–1.40)
Chloride: 98 mmol/L (ref 98–111)
Creatinine, Ser: 1.3 mg/dL — ABNORMAL HIGH (ref 0.61–1.24)
Glucose, Bld: 154 mg/dL — ABNORMAL HIGH (ref 70–99)
HCT: 47 % (ref 39.0–52.0)
Hemoglobin: 16 g/dL (ref 13.0–17.0)
Potassium: 3.1 mmol/L — ABNORMAL LOW (ref 3.5–5.1)
Sodium: 142 mmol/L (ref 135–145)
TCO2: 28 mmol/L (ref 22–32)

## 2019-09-09 SURGERY — COLONOSCOPY WITH PROPOFOL
Anesthesia: Monitor Anesthesia Care

## 2019-09-09 MED ORDER — PROPOFOL 500 MG/50ML IV EMUL
INTRAVENOUS | Status: DC | PRN
  Administered 2019-09-09: 140 ug/kg/min via INTRAVENOUS

## 2019-09-09 MED ORDER — PROPOFOL 500 MG/50ML IV EMUL
INTRAVENOUS | Status: AC
  Filled 2019-09-09: qty 100

## 2019-09-09 MED ORDER — PROPOFOL 1000 MG/100ML IV EMUL
INTRAVENOUS | Status: AC
  Filled 2019-09-09: qty 100

## 2019-09-09 MED ORDER — LIDOCAINE HCL (CARDIAC) PF 100 MG/5ML IV SOSY
PREFILLED_SYRINGE | INTRAVENOUS | Status: DC | PRN
  Administered 2019-09-09: 100 mg via INTRAVENOUS

## 2019-09-09 MED ORDER — PROPOFOL 10 MG/ML IV BOLUS
INTRAVENOUS | Status: AC
  Filled 2019-09-09: qty 20

## 2019-09-09 MED ORDER — PROPOFOL 10 MG/ML IV BOLUS
INTRAVENOUS | Status: DC | PRN
  Administered 2019-09-09: 40 mg via INTRAVENOUS

## 2019-09-09 MED ORDER — LABETALOL HCL 5 MG/ML IV SOLN
INTRAVENOUS | Status: DC | PRN
  Administered 2019-09-09 (×3): 10 mg via INTRAVENOUS

## 2019-09-09 MED ORDER — SODIUM CHLORIDE 0.9 % IV SOLN: INTRAVENOUS | Status: DC

## 2019-09-09 MED ORDER — LACTATED RINGERS IV SOLN: INTRAVENOUS | Status: DC

## 2019-09-09 MED ORDER — HYDRALAZINE HCL 20 MG/ML IJ SOLN
INTRAMUSCULAR | Status: DC | PRN
  Administered 2019-09-09 (×4): 10 mg via INTRAVENOUS

## 2019-09-09 SURGICAL SUPPLY — 21 items

## 2019-09-09 NOTE — Transfer of Care (Signed)
Immediate Anesthesia Transfer of Care Note  Patient: George Patton  Procedure(s) Performed: COLONOSCOPY WITH PROPOFOL (N/A ) POLYPECTOMY HEMOSTASIS CLIP PLACEMENT  Patient Location: Endoscopy Unit  Anesthesia Type:MAC  Level of Consciousness: awake, alert , oriented and patient cooperative  Airway & Oxygen Therapy: Patient Spontanous Breathing and Patient connected to face mask oxygen  Post-op Assessment: Report given to RN and Post -op Vital signs reviewed and stable  Post vital signs: Reviewed and stable  Last Vitals:  Vitals Value Taken Time  BP 151/63 09/09/19 0848  Temp    Pulse 103 09/09/19 0849  Resp 15 09/09/19 0849  SpO2 97 % 09/09/19 0849  Vitals shown include unvalidated device data.  Last Pain:  Vitals:   09/09/19 0653  TempSrc: Oral  PainSc: 0-No pain         Complications: No complications documented.

## 2019-09-09 NOTE — Op Note (Signed)
[facility] Patient Name: George Patton Procedure Date: 09/09/2019 MRN: [id number] Attending MD: Carol Ada , MD Date of Birth: 01/13/1970 CSN: [id number] Age: 49 Admit Type: Outpatient Procedure:                Colonoscopy Indications:              Screening for colorectal malignant neoplasm Providers:                Carol Ada, MD, Cleda Daub, RN, Elspeth Cho                            Tech., Technician, Stephanie [place] CRNA Referring MD:              Medicines:                 Complications:            No immediate complications. Estimated Blood Loss:     Estimated blood loss: 150 mL requiring treatment                            with placement of hemostatic clip(s). Procedure:                Pre-Anesthesia Assessment:                           - Prior to the procedure, a History and Physical                            was performed, and patient medications and                            allergies were reviewed. The patient's tolerance of                            previous anesthesia was also reviewed. The risks                            and benefits of the procedure and the sedation                            options and risks were discussed with the patient.                            All questions were answered, and informed consent                            was obtained. Prior Anticoagulants: The patient has                            taken no previous anticoagulant or antiplatelet                            agents. ASA Grade Assessment: III - A patient with  severe systemic disease. After reviewing the risks                            and benefits, the patient was deemed in                            satisfactory condition to undergo the procedure.                           - Sedation was administered by an anesthesia                            professional. Deep sedation was attained.                           After  obtaining informed consent, the colonoscope                            was passed under direct vision. Throughout the                            procedure, the patient's blood pressure, pulse, and                            oxygen saturations were monitored continuously. The                            CF-HQ190L ([id number]) Olympus colonoscope was                            introduced through the anus and advanced to the the                            cecum, identified by appendiceal orifice and                            ileocecal valve. The colonoscopy was technically                            difficult and complex. The patient tolerated the                            procedure well. The quality of the bowel                            preparation was good. The ileocecal valve,                            appendiceal orifice, and rectum were photographed. Scope In: 7:48:19 AM Scope Out: 2:42:35 AM Scope Withdrawal Time: 0 hours 45 minutes 24 seconds  Total Procedure Duration: 0 hours 49 minutes 35 seconds  Findings:      Three pedunculated polyps were found in the descending colon and       transverse colon. The polyps were 12 to 18 mm  in size. These polyps were       removed with a hot snare. Resection and retrieval were complete. To stop       active bleeding, six hemostatic clips were successfully placed (MR       unsafe). There was no bleeding at the end of the procedure.      Three sessile polyps were found in the rectum and transverse colon. The       polyps were 2 to 4 mm in size. These polyps were removed with a cold       snare. Resection was complete, but the polyp tissue was only partially       retrieved.      The patient's OSA and frequent colonic contractions made the procedure       very difficult to perform. The first pedunculated polyp, in the       transverse colon, was resected using a combination of pure coag and       Endocut III cautery. This resulted in a post  polypectomy bleed that was       arrested with placement of 3 successful hemoclips. This is where the       patient lost the 150 ml of blood as it was difficult to obtain a stable       position. The second pedunculated polyp, in the descending colon, was       removed with a pure coag current and no bleeding ensued. The final polyp       in the descending colon was removed with a pure coag current. There was       a high suspicion for a post polypectomy bleed and two hemoclips were       placed across the post polypectomy stalk. Placement of the clips were       difficult as positioning was an issue. Two out of the three cold snare       polyps were intentionally not retrieved in light of the the large       pedunculated polyps. Impression:               - Three 12 to 18 mm polyps in the descending colon                            and in the transverse colon, removed with a hot                            snare. Resected and retrieved. Clips (MR unsafe)                            were placed.                           - Three 2 to 4 mm polyps in the rectum and in the                            transverse colon, removed with a cold snare.                            Complete resection. Partial retrieval. Moderate Sedation:      Not Applicable - Patient had care per Anesthesia. Recommendation:           -  Patient has a contact number available for                            emergencies. The signs and symptoms of potential                            delayed complications were discussed with the                            patient. Return to normal activities tomorrow.                            Written discharge instructions were provided to the                            patient.                           - Return to normal activities tomorrow.                           - Resume previous diet.                           - Continue present medications.                           - Await  pathology results.                           - Repeat colonoscopy in 3 years for surveillance.                           - Use CPAP.                           - Weight loss. Procedure Code(s):        --- Professional ---                           [id number], Colonoscopy, flexible; with removal of                            tumor(s), polyp(s), or other lesion(s) by snare                            technique Diagnosis Code(s):        --- Professional ---                           Z12.11, Encounter for screening for malignant                            neoplasm of colon                           K62.1, Rectal polyp  K63.5, Polyp of colon CPT copyright 2019 American [employer] Association. All rights reserved. The codes documented in this report are preliminary and upon coder review may  be revised to meet current compliance requirements. Carol Ada, MD Carol Ada, MD 09/09/2019 9:01:33 AM This report has been signed electronically. Number of Addenda: 0

## 2019-09-09 NOTE — Discharge Instructions (Signed)

## 2019-09-09 NOTE — H&P (Signed)
  George Patton HPI: At this time the patient denies any problems with nausea, vomiting, fevers, chills, abdominal pain, diarrhea, constipation, hematochezia, melena, GERD, or dysphagia. The patient's maternal uncle was diagnosed at the age of 59 with colon cancer and he died at the age 50 with the disease. No complaints of chest pain, SOB, MI, or sleep apnea. He recalls that his last A1C was 8%.   Past Medical History:  Diagnosis Date  . Allergy   . Asthma   . Diabetes mellitus without complication ([place])   . Hypertension   . OSA on CPAP    No longer needs CPAP machine had a repeat sleep study    Past Surgical History:  Procedure Laterality Date  . HERNIA REPAIR    . VASECTOMY      Family History  Problem Relation Age of Onset  . Asthma Mother   . Mitral valve prolapse Mother   . Hypertension Mother   . Migraines Sister   . Asthma Son   . Cancer Father   . Stroke Maternal Grandmother   . Heart attack Maternal Grandfather     Social History:  reports that he quit smoking about 6 years ago. His smoking use included cigarettes. He smoked 0.25 packs per day. He does not have any smokeless tobacco history on file. He reports current alcohol use of about 1.0 standard drink of alcohol per week. He reports that he does not use drugs.  Allergies:  Allergies  Allergen Reactions  . Shellfish Allergy Anaphylaxis    Medications:  Scheduled:  Continuous: . sodium chloride    . lactated ringers 10 mL/hr at 09/09/19 0716    Results for orders placed or performed during the hospital encounter of 09/09/19 (from the past 24 hour(s))  I-STAT, chem 8     Status: Abnormal   Collection Time: 09/09/19  7:16 AM  Result Value Ref Range   Sodium 142 135 - 145 mmol/L   Potassium 3.1 (L) 3.5 - 5.1 mmol/L   Chloride 98 98 - 111 mmol/L   BUN 11 6 - 20 mg/dL   Creatinine, Ser 1.30 (H) 0.61 - 1.24 mg/dL   Glucose, Bld 154 (H) 70 - 99 mg/dL   Calcium, Ion 1.15 1.15 - 1.40 mmol/L   TCO2 28 22 -  32 mmol/L   Hemoglobin 16.0 13.0 - 17.0 g/dL   HCT 47.0 39 - 52 %     No results found.  ROS:  As stated above in the HPI otherwise negative.  Blood pressure (!) 219/99, pulse 100, temperature 98.3 F (36.8 C), temperature source Oral, resp. rate 18, height 6\' 4"  (1.93 m), weight (!) 167.8 kg, SpO2 100 %.    PE: Gen: NAD, Alert and Oriented HEENT:  [place]/AT, EOMI Neck: Supple, no LAD Lungs: CTA Bilaterally CV: RRR without M/G/R ABD: Soft, NTND, +BS Ext: No C/C/E  Assessment/Plan: 1) Screening colonoscopy  Keylan Costabile D 09/09/2019, 7:36 AM

## 2019-09-09 NOTE — Anesthesia Postprocedure Evaluation (Signed)
Anesthesia Post Note  Patient: EDNA GROVER  Procedure(s) Performed: COLONOSCOPY WITH PROPOFOL (N/A ) POLYPECTOMY HEMOSTASIS CLIP PLACEMENT     Patient location during evaluation: PACU Anesthesia Type: MAC Level of consciousness: awake and alert Pain management: pain level controlled Vital Signs Assessment: post-procedure vital signs reviewed and stable Respiratory status: spontaneous breathing, nonlabored ventilation, respiratory function stable and patient connected to nasal cannula oxygen Cardiovascular status: stable and blood pressure returned to baseline Postop Assessment: no apparent nausea or vomiting Anesthetic complications: no   No complications documented.  Last Vitals:  Vitals:   09/09/19 0849 09/09/19 0900  BP: (!) 151/63 (!) 173/93  Pulse: (!) 105 (!) 104  Resp: 20 14  Temp: 37.1 C   SpO2: 97% 97%    Last Pain:  Vitals:   09/09/19 0900  TempSrc:   PainSc: 0-No pain                 Ramani Riva

## 2019-09-11 ENCOUNTER — Encounter (HOSPITAL_COMMUNITY): Payer: Self-pay | Admitting: Gastroenterology

## 2019-09-13 LAB — SURGICAL PATHOLOGY

## 2019-09-28 ENCOUNTER — Encounter: Payer: 59 | Attending: Surgery | Admitting: Skilled Nursing Facility1

## 2019-09-28 DIAGNOSIS — E669 Obesity, unspecified: Secondary | ICD-10-CM | POA: Insufficient documentation

## 2019-10-03 ENCOUNTER — Encounter: Payer: Self-pay | Admitting: Dietician

## 2019-10-03 ENCOUNTER — Other Ambulatory Visit: Payer: Self-pay

## 2019-10-03 ENCOUNTER — Encounter: Payer: 59 | Admitting: Dietician

## 2019-10-03 DIAGNOSIS — E669 Obesity, unspecified: Secondary | ICD-10-CM

## 2019-10-03 NOTE — Patient Instructions (Signed)
   Be thinking about snack/meal options to have throughout the day at work  Practice not drinking with meals

## 2019-10-03 NOTE — Progress Notes (Signed)
Supervised Weight Loss Visit Bariatric Nutrition Education  Planned Surgery: Sleeve Pt Expectation of Surgery/ Goals: to come off meds  5 out of 6 SWL Appointments    NUTRITION ASSESSMENT  Anthropometrics  Start weight at [facility]: 387.7 lbs (date: 04/21/2019) Today's weight: 370.7 lbs BMI: 45.7 kg/m2    Clinical  Medical Hx: OSA, DM  Lifestyle & Dietary Hx Patient states he has been working on eating breakfast at home at least 4 days per week now. May have yogurt for breakfast. States he typically does not snack, will usually have 3 meals per day but sometimes misses lunch. States he is getting better at recognizing when he is full and that he does not have to eat everything on his plate. For example, had a sub for lunch but opted out of having chips/a side because he wasn't hungry enough for it.    Estimated daily fluid intake: 64+ oz Current average weekly physical activity: ADL's; walking a lot at work, not sedentary throughout the day   24-Hr Dietary Recall First Meal: oatmeal + eggs + toast Snack: -  Second Meal: [place] [employer] 6-inch sub Snack: -  Third Meal: spaghetti  Snack: dessert Beverages: coffee, water, lemonade, sweet liquor once a week (rum and coke)  Estimated Energy Needs Calories: 2000 Carbohydrate: 225g Protein: 150g Fat: 56g   NUTRITION DIAGNOSIS  Overweight/obesity (-3.3) related to past poor dietary habits and physical inactivity as evidenced by patient w/ planned Sleeve surgery following dietary guidelines for continued weight loss.   NUTRITION INTERVENTION  Nutrition counseling (C-1) and education (E-2) to facilitate bariatric surgery goals.  Pre-Op Goals Progress & New Goals  Continue: Practice CHEWING your food (aim for applesauce consistency)  Continue: Aim for 64-100 ounces of FLUID daily (with at least half of fluid intake being plain water)   Purchased a water bottle   Continue: Eat breakfast from home 2-3 times a week  Continue:  Carve out time for lunch   Continue: Identify fullness verses satiety  Continue: Being mindful and in the moment with meals and relationship with food  NEW: Be thinking about snack/meal options to have throughout the day at work  NEW: Practice not drinking with meals   Handouts Provided Include   Bariatric Snack Ideas   Learning Style & Readiness for Change Teaching method utilized: Visual & Auditory  Demonstrated degree of understanding via: Teach Back  Barriers to learning/adherence to lifestyle change: None Identified     MONITORING & EVALUATION Dietary intake, weekly physical activity, body weight, and pre-op goals in 1 month.   Next Steps  Patient is to return to [facility] for 6th SWL visit in 1 month.

## 2019-10-31 ENCOUNTER — Ambulatory Visit: Payer: 59 | Admitting: Skilled Nursing Facility1

## 2019-11-02 ENCOUNTER — Other Ambulatory Visit: Payer: Self-pay

## 2019-11-02 ENCOUNTER — Encounter: Payer: 59 | Attending: Surgery | Admitting: Skilled Nursing Facility1

## 2019-11-02 DIAGNOSIS — E669 Obesity, unspecified: Secondary | ICD-10-CM | POA: Insufficient documentation

## 2019-11-02 NOTE — Progress Notes (Signed)
Supervised Weight Loss Visit Bariatric Nutrition Education  Planned Surgery: Sleeve Pt Expectation of Surgery/ Goals: to come off meds  6 out of 6 SWL Appointments    NUTRITION ASSESSMENT  Anthropometrics  Start weight at [facility]: 387.7 lbs (date: 04/21/2019) Today's weight: 373 lbs BMI: 45.40 kg/m2    Clinical  Medical Hx: OSA, DM  Lifestyle & Dietary Hx  Pt states he has recently had a passing in his family leading to the weight gain. Pt states he has been learned small portions will be neccessary and maintain the vitamin regimen is important as well as not drinking while eating. Pt states he feels he may ned to continue to work on not overeating with gatherings and taking breaks at work.   Estimated daily fluid intake: 64+ oz Current average weekly physical activity: ADL's; walking a lot at work, not sedentary throughout the day   24-Hr Dietary Recall First Meal: oatmeal + eggs + toast Snack: -  Second Meal: [place] [employer] 6-inch sub Snack: -  Third Meal: spaghetti  Snack: dessert Beverages: coffee, water, lemonade, sweet liquor once a week (rum and coke)  Estimated Energy Needs Calories: 2000 Carbohydrate: 225g Protein: 150g Fat: 56g   NUTRITION DIAGNOSIS  Overweight/obesity ([place]-3.3) related to past poor dietary habits and physical inactivity as evidenced by patient w/ planned Sleeve surgery following dietary guidelines for continued weight loss.   NUTRITION INTERVENTION  Nutrition counseling (C-1) and education (E-2) to facilitate bariatric surgery goals.  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

## 2019-11-17 ENCOUNTER — Ambulatory Visit (INDEPENDENT_AMBULATORY_CARE_PROVIDER_SITE_OTHER): Payer: 59 | Admitting: Psychology

## 2019-11-17 DIAGNOSIS — F509 Eating disorder, unspecified: Secondary | ICD-10-CM | POA: Diagnosis not present

## 2019-12-14 ENCOUNTER — Other Ambulatory Visit: Payer: Self-pay

## 2019-12-14 ENCOUNTER — Encounter: Payer: Self-pay | Admitting: Internal Medicine

## 2019-12-14 ENCOUNTER — Ambulatory Visit: Payer: 59 | Admitting: Internal Medicine

## 2019-12-14 VITALS — BP 177/96 | HR 76 | Ht 75.5 in | Wt 383.2 lb

## 2019-12-14 DIAGNOSIS — E119 Type 2 diabetes mellitus without complications: Secondary | ICD-10-CM

## 2019-12-14 DIAGNOSIS — I10 Essential (primary) hypertension: Secondary | ICD-10-CM | POA: Diagnosis not present

## 2019-12-14 DIAGNOSIS — K760 Fatty (change of) liver, not elsewhere classified: Secondary | ICD-10-CM | POA: Diagnosis not present

## 2019-12-14 DIAGNOSIS — Z9989 Dependence on other enabling machines and devices: Secondary | ICD-10-CM

## 2019-12-14 DIAGNOSIS — G4733 Obstructive sleep apnea (adult) (pediatric): Secondary | ICD-10-CM

## 2019-12-14 NOTE — Patient Instructions (Signed)
Medication Instructions:  NO CHANGES *If you need a refill on your cardiac medications before your next appointment, please call your pharmacy*   Lab Work: Lipid Panel, Renin-Aldosterone Ratio, CMET If you have labs (blood work) drawn today and your tests are completely normal, you will receive your results only by: Marland Kitchen MyChart Message (if you have MyChart) OR . A paper copy in the mail If you have any lab test that is abnormal or we need to change your treatment, we will call you to review the results.   Follow-Up: At [facility], you and your health needs are our priority.  As part of our continuing mission to provide you with exceptional heart care, we have created designated Provider Care Teams.  These Care Teams include your primary Cardiologist (physician) and Advanced Practice Providers (APPs -  Physician Assistants and Nurse Practitioners) who all work together to provide you with the care you need, when you need it.  We recommend signing up for the patient portal called "MyChart".  Sign up information is provided on this After Visit Summary.  MyChart is used to connect with patients for Virtual Visits (Telemedicine).  Patients are able to view lab/test results, encounter notes, upcoming appointments, etc.  Non-urgent messages can be sent to your provider as well.   To learn more about what you can do with MyChart, go to [email or web address].    Your next appointment:   3 month(s)  The format for your next appointment:   In Person  Provider:   You may see Dr. Debara Pickett or one of the following Advanced Practice Providers on your designated Care Team:    Almyra Deforest, PA-C  Fabian Sharp, [place] or   Roby Lofts, [place]    Other Instructions

## 2019-12-14 NOTE — Progress Notes (Signed)
OFFICE CONSULT NOTE  Chief Complaint:  Resistant hypertension  Primary Care Physician: [name], MD  HPI:  George Patton is a 49 y.o. male who is being seen today for the evaluation of distal hypertension at the request of [name], MD.  This is a pleasant 49 year old male with a history of hypertension that is longstanding probably since his 27s.  He also has type 2 diabetes, sleep apnea on CPAP recently started within a few months, responding well, allergies and asthma as well as morbid obesity with plans for bariatric surgery in the next few months.  He was referred for resistant hypertension.  At this point he is on 6 different medications including combinations of carvedilol 25 mg twice daily, clonidine 0.3 mg nightly, lisinopril/HCTZ 20/12.5 mg (2 tablets) daily, and telmisartan/amlodipine 80/10 mg daily.  He reports compliance with her medications however persistently sees elevated blood pressures at around 170 similar to today.  He is physically active.  He works for the [employer].  He denies any chest pain or worsening shortness of breath.  There is no early onset heart disease in his family.  His mother had mitral valve prolapse and has had some arrhythmias.  He says he gets occasional palpitations.  He is able to do more than 4 metabolic equivalents of activity without issue.  His PCP also did some resistant hypertension work-up including 2020 which showed mild stenosis of the right renal artery with normal right kidney size and no evidence of stenosis to the left kidney artery.  Probable hepatic steatosis was noted.  PMHx:  Past Medical History:  Diagnosis Date  . Allergy   . Asthma   . Diabetes mellitus without complication ([place])   . Hypertension   . OSA on CPAP    No longer needs CPAP machine had a repeat sleep study    Past Surgical History:  Procedure Laterality Date  . COLONOSCOPY WITH PROPOFOL N/A 09/09/2019   Procedure: COLONOSCOPY WITH  PROPOFOL;  Surgeon: Carol Ada, MD;  Location: [facility];  Service: Endoscopy;  Laterality: N/A;  . HEMOSTASIS CLIP PLACEMENT  09/09/2019   Procedure: HEMOSTASIS CLIP PLACEMENT;  Surgeon: Carol Ada, MD;  Location: [facility];  Service: Endoscopy;;  . HERNIA REPAIR    . POLYPECTOMY  09/09/2019   Procedure: POLYPECTOMY;  Surgeon: Carol Ada, MD;  Location: [facility];  Service: Endoscopy;;  . VASECTOMY      FAMHx:  Family History  Problem Relation Age of Onset  . Asthma Mother   . Mitral valve prolapse Mother   . Hypertension Mother   . Migraines Sister   . Asthma Son   . Cancer Father   . Stroke Maternal Grandmother   . Heart attack Maternal Grandfather     SOCHx:   reports that he quit smoking about 6 years ago. His smoking use included cigarettes. He smoked 0.25 packs per day. He has never used smokeless tobacco. He reports current alcohol use of about 1.0 standard drink of alcohol per week. He reports that he does not use drugs.  ALLERGIES:  Allergies  Allergen Reactions  . Shellfish Allergy Anaphylaxis    ROS: Pertinent items noted in HPI and remainder of comprehensive ROS otherwise negative.  HOME MEDS: Current Outpatient Medications on File Prior to Visit  Medication Sig Dispense Refill  . albuterol (PROVENTIL HFA;VENTOLIN HFA) 108 (90 BASE) MCG/ACT inhaler Inhale 2 puffs into the lungs every 6 (six) hours as needed for wheezing or shortness of  breath. 1 Inhaler 2  . azelastine (OPTIVAR) 0.05 % ophthalmic solution Place 1 drop into both eyes daily.    Marland Kitchen buPROPion (WELLBUTRIN XL) 150 MG 24 hr tablet Take 150 mg by mouth daily.    . carvedilol (COREG) 25 MG tablet Take 25 mg by mouth 2 (two) times daily.    . Cholecalciferol (VITAMIN D3 PO) Take 1 capsule by mouth daily.    . cloNIDine (CATAPRES) 0.3 MG tablet Take 0.3 mg by mouth daily.    Marland Kitchen EPINEPHrine (EPIPEN 2-PAK) 0.3 mg/0.3 mL IJ SOAJ injection Inject 0.3 mLs (0.3 mg total) into the muscle once. Repeat  if needed 2 Device 1  . fluticasone (FLONASE) 50 MCG/ACT nasal spray Place 1 spray into both nostrils daily.    Marland Kitchen levocetirizine (XYZAL) 5 MG tablet Take 5 mg by mouth daily.    Marland Kitchen lisinopril-hydrochlorothiazide (PRINZIDE,ZESTORETIC) 20-12.5 MG tablet TAKE TWO TABLETS BY MOUTH  DAILY (Patient taking differently: Take 2 tablets by mouth daily. ) 60 tablet 11  . metFORMIN (GLUCOPHAGE) 500 MG tablet TAKE 1 TABLET BY MOUTH AT BEDTIME THEN  INCREASE  TO  TWICE  DAILY  AS  DIRECTED (Patient taking differently: Take 1,000 mg by mouth 2 (two) times daily. ) 60 tablet 0  . montelukast (SINGULAIR) 10 MG tablet Take 10 mg by mouth daily.    . Probiotic CAPS Take 1 capsule by mouth daily.    . sildenafil (VIAGRA) 100 MG tablet Take 100 mg by mouth as needed for erectile dysfunction.    . Telmisartan-amLODIPine 80-10 MG TABS Take by mouth.    . Vitamin D, Ergocalciferol, (DRISDOL) 1.25 MG (50000 UNIT) CAPS capsule Take 50,000 Units by mouth once a week.     No current facility-administered medications on file prior to visit.    LABS/IMAGING: No results found for this or any previous visit (from the past 48 hour(s)). No results found.  LIPID PANEL:    Component Value Date/Time   CHOL 136 06/09/2016 1332   TRIG 115.0 06/09/2016 1332   HDL 38.00 (L) 06/09/2016 1332   CHOLHDL 4 06/09/2016 1332   VLDL 23.0 06/09/2016 1332   LDLCALC 75 06/09/2016 1332    WEIGHTS: Wt Readings from Last 3 Encounters:  12/14/19 (!) 383 lb 3.2 oz (173.8 kg)  11/02/19 (!) 373 lb (169.2 kg)  10/03/19 (!) 367 lb (166.5 kg)    VITALS: BP (!) 177/96   Pulse 76   Ht 6' 3.5" (1.918 m)   Wt (!) 383 lb 3.2 oz (173.8 kg)   SpO2 100%   BMI 47.26 kg/m   EXAM: General appearance: alert, no distress and morbidly obese Neck: no carotid bruit, no JVD and thyroid not enlarged, symmetric, no tenderness/mass/nodules Lungs: diminished breath sounds bilaterally Heart: regular rate and rhythm Abdomen: soft, non-tender; bowel  sounds normal; no masses,  no organomegaly Extremities: extremities normal, atraumatic, no cyanosis or edema Pulses: 2+ and symmetric Skin: Skin color, texture, turgor normal. No rashes or lesions Neurologic: Grossly normal Psych: Pleasant  EKG: Normal sinus rhythm at 76- personally reviewed  ASSESSMENT: 1. Resistant hypertension 2. Mild right renal artery stenosis 3. Morbid obesity 4. OSA on CPAP 5. Type 2 diabetes 6. Probable hepatic steatosis  PLAN: 1.   Mr. Skibinski has had resistant hypertension and is now on 6 medications.  He is contemplating bariatric surgery.  From a cardiac standpoint his blood pressure has been stable although not at target.  He is at acceptable risk for surgery.  It does  not seem that he has significant renal artery stenosis as a cause of his symptoms however he could have hyperaldosteronism.  I would like to check a renin aldosterone ratio and he may be a candidate to add spironolactone.  If it is significantly abnormal we may be able to titrate that up and remove some of his medications which caused him side effects, particularly amlodipine and clonidine.  He was at one point instructed to take the combination telmisartan 80/10 mg amlodipine twice daily however that caused him a lot of side effects.  There likely is little additional benefit for more than 10 mg of amlodipine daily.  If he is not responding to these therapies or we do not see some improvement in blood pressure with the weight loss, he ultimately might benefit from seeing my partner Dr. Oval Linsey who runs our resistant hypertension clinic.  He could possibly be a candidate for the baro-stim carotid sinus stimulator procedure.'  Thanks again for the kind referral.  Pixie Casino, MD, [facility], [place] Director of the Advanced Lipid Disorders &  Cardiovascular Risk Reduction Clinic Diplomate of the American Board of Clinical Lipidology Attending Cardiologist   Direct Dial: [phone]  Fax: [phone]  Website:  [email or web address] 12/14/2019, 9:12 AM

## 2019-12-19 LAB — COMPREHENSIVE METABOLIC PANEL
ALT: 29 IU/L (ref 0–44)
AST: 17 IU/L (ref 0–40)
Albumin/Globulin Ratio: 1.6 (ref 1.2–2.2)
Albumin: 4.2 g/dL (ref 4.0–5.0)
Alkaline Phosphatase: 97 IU/L (ref 44–121)
BUN/Creatinine Ratio: 11 (ref 9–20)
BUN: 15 mg/dL (ref 6–24)
Bilirubin Total: 0.3 mg/dL (ref 0.0–1.2)
CO2: 21 mmol/L (ref 20–29)
Calcium: 9.4 mg/dL (ref 8.7–10.2)
Chloride: 101 mmol/L (ref 96–106)
Creatinine, Ser: 1.41 mg/dL — ABNORMAL HIGH (ref 0.76–1.27)
GFR calc Af Amer: 67 mL/min/{1.73_m2} (ref >59)
GFR calc non Af Amer: 58 mL/min/{1.73_m2} — ABNORMAL LOW (ref >59)
Globulin, Total: 2.7 g/dL (ref 1.5–4.5)
Glucose: 153 mg/dL — ABNORMAL HIGH (ref 65–99)
Potassium: 3.8 mmol/L (ref 3.5–5.2)
Sodium: 140 mmol/L (ref 134–144)
Total Protein: 6.9 g/dL (ref 6.0–8.5)

## 2019-12-19 LAB — ALDOSTERONE + RENIN ACTIVITY W/ RATIO
ALDOS/RENIN RATIO: 59.2 — ABNORMAL HIGH (ref 0.0–30.0)
ALDOSTERONE: 12.9 ng/dL (ref 0.0–30.0)
Renin: 0.218 ng/mL/hr (ref 0.167–5.380)

## 2019-12-19 LAB — LIPID PANEL
Chol/HDL Ratio: 4.3 ratio (ref 0.0–5.0)
Cholesterol, Total: 162 mg/dL (ref 100–199)
HDL: 38 mg/dL — ABNORMAL LOW (ref >39)
LDL Chol Calc (NIH): 105 mg/dL — ABNORMAL HIGH (ref 0–99)
Triglycerides: 103 mg/dL (ref 0–149)
VLDL Cholesterol Cal: 19 mg/dL (ref 5–40)

## 2019-12-23 ENCOUNTER — Other Ambulatory Visit: Payer: Self-pay | Admitting: *Deleted

## 2019-12-23 ENCOUNTER — Telehealth: Payer: Self-pay | Admitting: Internal Medicine

## 2019-12-23 DIAGNOSIS — Z79899 Other long term (current) drug therapy: Secondary | ICD-10-CM

## 2019-12-23 DIAGNOSIS — I10 Essential (primary) hypertension: Secondary | ICD-10-CM

## 2019-12-23 MED ORDER — SPIRONOLACTONE 25 MG PO TABS: 25.0000 mg | ORAL_TABLET | Freq: Every day | ORAL | 3 refills | Status: DC

## 2019-12-23 NOTE — Telephone Encounter (Signed)
Spoke with patient and reviewed lab results per MD. He voiced understanding of med change and repeat labs  Routed to MD for comment on lipid panel

## 2019-12-23 NOTE — Telephone Encounter (Signed)
Message sent to patient via MyChart. 

## 2019-12-23 NOTE — Telephone Encounter (Signed)
Lipids are not bad - would not treat at this time.  Dr Lemmie Evens

## 2019-12-23 NOTE — Telephone Encounter (Signed)
Patient returning Jenna's call about lab results

## 2019-12-26 ENCOUNTER — Encounter: Payer: 59 | Attending: Surgery | Admitting: Skilled Nursing Facility1

## 2019-12-26 ENCOUNTER — Other Ambulatory Visit: Payer: Self-pay

## 2019-12-26 DIAGNOSIS — E669 Obesity, unspecified: Secondary | ICD-10-CM

## 2019-12-27 ENCOUNTER — Ambulatory Visit: Payer: Self-pay | Admitting: Surgery

## 2019-12-27 NOTE — H&P (Signed)
Surgical Evaluation  Chief Complaint: morbid obesity  HPI: 49yo man returns for follow up regarding surgical management of morbid obesity. He denies any changes in his health since our initial meeting in March of this year. He did meet with Dr. Debara Pickett and has had his HT meds adjusted somewhat. He has completed the bariatric pathway with no barriers identified and is ready to proceed with sleeve gastrectomy.     Labs (10/20/19): H pylori negative, HgA1C 7.2, otherwise unremarkable (CBC, CMP, folic acid, iron, TSH, B12, lipid panel, Vit D) CXR/UGI 04/01/19- negative, no hiatal hernia Dietician Lexine Baton Short 04/21/19)- approved Psychology Clarice Pole 07/05/19 and 11/17/19)- approved  Initial Visit 03/18/19: This is a very pleasant 49 year old man who presents today to discuss surgical management of severe obesity.  He has been struggling with this for at least the last 10 years, but more so in the last few years.  He's been considering surgery for about a year.  He has tried numerous methods of weight loss without success.  He reports his current diet is well balanced.  His wife is gluten-free and his daughter is a vegetarian, so that affects his intake.  He does admit that the carbohydrate volume in his diet might be higher than it should be.  He is fairly active through his job.  He is interested in sleeve gastrectomy.  Medical history: hypertension, diabetes, asthma, anxiety disorder, reflux, sleep apnea. PCP is Dr. Horald Pollen. Surgical history: inguinal hernia repair, vasectomy Family history: asthma, hypertension, cancer, migraines, stroke, CAD/ MI Social history: he is a Engineer, maintenance for [employer], lots of walking and moving with this job. He lives with his wife, their son and daughter. NO drug or tobacco use, rare etOH.  393lb/ BMI 47.8   Allergies  Allergen Reactions   Shellfish Allergy Anaphylaxis    Past Medical History:  Diagnosis Date   Allergy    Asthma    Diabetes  mellitus without complication ([place])    Hypertension    OSA on CPAP    No longer needs CPAP machine had a repeat sleep study    Past Surgical History:  Procedure Laterality Date   COLONOSCOPY WITH PROPOFOL N/A 09/09/2019   Procedure: COLONOSCOPY WITH PROPOFOL;  Surgeon: Carol Ada, MD;  Location: [facility];  Service: Endoscopy;  Laterality: N/A;   HEMOSTASIS CLIP PLACEMENT  09/09/2019   Procedure: HEMOSTASIS CLIP PLACEMENT;  Surgeon: Carol Ada, MD;  Location: [facility];  Service: Endoscopy;;   HERNIA REPAIR     POLYPECTOMY  09/09/2019   Procedure: POLYPECTOMY;  Surgeon: Carol Ada, MD;  Location: [facility];  Service: Endoscopy;;   VASECTOMY      Family History  Problem Relation Age of Onset   Asthma Mother    Mitral valve prolapse Mother    Hypertension Mother    Migraines Sister    Asthma Son    Cancer Father    Stroke Maternal Grandmother    Heart attack Maternal Grandfather     Social History   Socioeconomic History   Marital status: Married    Spouse name: Not on file   Number of children: 3   Years of education: BS   Highest education level: Not on file  Occupational History   Not on file  Tobacco Use   Smoking status: Former Smoker    Packs/day: 0.25    Types: Cigarettes    Quit date: 08/06/2013    Years since quitting: 6.3   Smokeless tobacco: Never Used  Substance and Sexual Activity   Alcohol use: Yes    Alcohol/week: 1.0 standard drink    Types: 1 Shots of liquor per week    Comment: Once a month    Drug use: No   Sexual activity: Yes  Other Topics Concern   Not on file  Social History Narrative   Consumes about 2 cups of coffee a day    Social Determinants of Health   Financial Resource Strain: Not on file  Food Insecurity: Not on file  Transportation Needs: Not on file  Physical Activity: Not on file  Stress: Not on file  Social Connections: Not on file    Current Outpatient Medications on File Prior to Visit  Medication Sig  Dispense Refill   albuterol (PROVENTIL HFA;VENTOLIN HFA) 108 (90 BASE) MCG/ACT inhaler Inhale 2 puffs into the lungs every 6 (six) hours as needed for wheezing or shortness of breath. 1 Inhaler 2   azelastine (OPTIVAR) 0.05 % ophthalmic solution Place 1 drop into both eyes daily.     buPROPion (WELLBUTRIN XL) 150 MG 24 hr tablet Take 150 mg by mouth daily.     carvedilol (COREG) 25 MG tablet Take 25 mg by mouth 2 (two) times daily.     Cholecalciferol (VITAMIN D3 PO) Take 1 capsule by mouth daily.     cloNIDine (CATAPRES) 0.3 MG tablet Take 0.3 mg by mouth daily.     EPINEPHrine (EPIPEN 2-PAK) 0.3 mg/0.3 mL IJ SOAJ injection Inject 0.3 mLs (0.3 mg total) into the muscle once. Repeat if needed 2 Device 1   fluticasone (FLONASE) 50 MCG/ACT nasal spray Place 1 spray into both nostrils daily.     levocetirizine (XYZAL) 5 MG tablet Take 5 mg by mouth daily.     lisinopril-hydrochlorothiazide (PRINZIDE,ZESTORETIC) 20-12.5 MG tablet TAKE TWO TABLETS BY MOUTH  DAILY (Patient taking differently: Take 2 tablets by mouth daily. ) 60 tablet 11   metFORMIN (GLUCOPHAGE) 500 MG tablet TAKE 1 TABLET BY MOUTH AT BEDTIME THEN  INCREASE  TO  TWICE  DAILY  AS  DIRECTED (Patient taking differently: Take 1,000 mg by mouth 2 (two) times daily. ) 60 tablet 0   montelukast (SINGULAIR) 10 MG tablet Take 10 mg by mouth daily.     Probiotic CAPS Take 1 capsule by mouth daily.     sildenafil (VIAGRA) 100 MG tablet Take 100 mg by mouth as needed for erectile dysfunction.     spironolactone (ALDACTONE) 25 MG tablet Take 1 tablet (25 mg total) by mouth daily. 90 tablet 3   Telmisartan-amLODIPine 80-10 MG TABS Take by mouth.     Vitamin D, Ergocalciferol, (DRISDOL) 1.25 MG (50000 UNIT) CAPS capsule Take 50,000 Units by mouth once a week.     No current facility-administered medications on file prior to visit.    Review of Systems: a complete, 10pt review of systems was completed with pertinent positives and negatives as  documented in the HPI  Physical Exam: N/a- phone visit   CBC Latest Ref Rng & Units 09/09/2019 06/20/2015 04/24/2014  WBC 4.0 - 10.5 K/uL - 8.8 7.7  Hemoglobin 13.0 - 17.0 g/dL 16.116.0 09.614.6 04.514.9  Hematocrit 39.0 - 52.0 % 47.0 44.7 45.0  Platelets 150.0 - 400.0 K/uL - 255.0 252    CMP Latest Ref Rng & Units 12/14/2019 09/09/2019 07/16/2016  Glucose 65 - 99 mg/dL 409(W153(H) 119(J154(H) 478(G114(H)  BUN 6 - 24 mg/dL 15 11 17   Creatinine 0.76 - 1.27 mg/dL 9.56(O1.41(H) 1.30(Q1.30(H) 6.571.31  Sodium 134 -  144 mmol/L 140 142 139  Potassium 3.5 - 5.2 mmol/L 3.8 3.1(L) 3.5  Chloride 96 - 106 mmol/L 101 98 104  CO2 20 - 29 mmol/L 21 - 28  Calcium 8.7 - 10.2 mg/dL 9.4 - 9.4  Total Protein 6.0 - 8.5 g/dL 6.9 - 7.2  Total Bilirubin 0.0 - 1.2 mg/dL 0.3 - 0.6  Alkaline Phos 44 - 121 IU/L 97 - 65  AST 0 - 40 IU/L 17 - 25  ALT 0 - 44 IU/L 29 - 36    No results found for: INR, PROTIME  Imaging: No results found.   A/P:  MORBID OBESITY (E66.01) Story: He is a good candidate for sleeve gastrectomy. We discussed that this will have a good effect on his diabetes although not as powerful as a gastric bypass. We discussed the surgery including technical aspects, the risks of bleeding, infection, pain, scarring, injury to intra-abdominal structures, staple line leak or abscess, chronic abdominal pain or nausea, new onset or worsened GERD, DVT/PE, pneumonia, heart attack, stroke, death, failure to reach weight loss goals and weight regain, hernia. Discussed the typical peri-, and postoperative course. Discussed the importance of lifelong behavioral changes to combat the chronic and relapsing disease which is obesity. Questions were welcomed and answered. Plan to proceed as scheduled on 01/17/20. DIABETES (E11.9) Story: Medications include metformin HYPERTENSION (I10) Story: Multiple medications, managed by Dr. Debara Pickett. ANXIETY DISORDER (F41.9) Story: Takes bupropion SLEEP APNEA (G47.30) GERD (GASTROESOPHAGEAL REFLUX DISEASE)  (K21.9) Story: Situational, no medications ASTHMA (J45.909)   Patient Active Problem List   Diagnosis Date Noted   Non-insulin dependent type 2 diabetes mellitus ([place]) 05/16/2013   OSA (obstructive sleep apnea) 01/24/2013   Obesity 01/24/2013   Hypertension 02/12/2011       Romana Juniper, MD [facility]  See AMION to contact appropriate on-call provider

## 2019-12-28 NOTE — Progress Notes (Signed)
Pre-Operative Nutrition Class:  Appt start time: 4585   End time:  1830.  Patient was seen on 12/28/2019 for Pre-Operative Bariatric Surgery Education at [facility].    Surgery date:  Surgery type: sleeve Start weight at [facility]: 387.7 Weight today: 380.6  Samples given per MNT protocol. Patient educated on appropriate usage: Procare Multivitamin Lot [id number] Exp:01/2021  Celebrate Calcium  Lot # [id number] Exp:04/2021  Protein02 drink Shake Lot #[id number] Exp: 10/23  The following the learning objectives were met by the patient during this course:  Identify Pre-Op Dietary Goals and will begin 2 weeks pre-operatively  Identify appropriate sources of fluids and proteins   State protein recommendations and appropriate sources pre and post-operatively  Identify Post-Operative Dietary Goals and will follow for 2 weeks post-operatively  Identify appropriate multivitamin and calcium sources  Describe the need for physical activity post-operatively and will follow MD recommendations  State when to call healthcare provider regarding medication questions or post-operative complications  Handouts given during class include:  Pre-Op Bariatric Surgery Diet Handout  Protein Shake Handout  Post-Op Bariatric Surgery Nutrition Handout  [facility] Program Information Flyer  Support Group Information Flyer  [facility] Bariatric Supplements Price List  Follow-Up Plan: Patient will follow-up at [facility] 2 weeks post operatively for diet advancement per MD.

## 2020-01-13 ENCOUNTER — Other Ambulatory Visit (HOSPITAL_COMMUNITY): Payer: 59

## 2020-01-16 ENCOUNTER — Encounter (HOSPITAL_COMMUNITY): Payer: 59

## 2020-01-17 ENCOUNTER — Encounter (HOSPITAL_COMMUNITY): Admission: RE | Payer: Self-pay | Source: Home / Self Care

## 2020-01-17 ENCOUNTER — Inpatient Hospital Stay (HOSPITAL_COMMUNITY): Admission: RE | Admit: 2020-01-17 | Payer: 59 | Source: Home / Self Care | Admitting: Surgery

## 2020-01-17 SURGERY — GASTRECTOMY, SLEEVE, LAPAROSCOPIC
Anesthesia: General

## 2020-01-31 ENCOUNTER — Ambulatory Visit: Payer: 59

## 2020-02-15 NOTE — Patient Instructions (Addendum)
DUE TO COVID-19 ONLY ONE VISITOR IS ALLOWED TO COME WITH YOU AND STAY IN THE WAITING ROOM ONLY DURING PRE OP AND PROCEDURE DAY OF SURGERY. THE 1 VISITOR  MAY VISIT WITH YOU AFTER SURGERY IN YOUR PRIVATE ROOM DURING VISITING HOURS ONLY!                 Alyson Locket    Your procedure is scheduled on: 02/27/20   Report to [facility]  Entrance   Report to Short Stay at 5:30 AM     Call this number if you have problems the morning of surgery [place], NO CHEWING GUM [place].      NO SOLID FOOD AFTER 6:00 PM THE NIGHT BEFORE YOUR SURGERY.   YOU MAY DRINK CLEAR FLUIDS.   THE G2 DRINK YOU DRINK BEFORE YOU LEAVE HOME WILL BE THE LAST FLUIDS YOU DRINK BEFORE SURGERY. 8 GM carbs,low sugar clear drink  PAIN IS EXPECTED AFTER SURGERY AND WILL NOT BE COMPLETELY ELIMINATED.   AMBULATION AND TYLENOL WILL HELP REDUCE INCISIONAL AND GAS PAIN. MOVEMENT IS KEY!  YOU ARE EXPECTED TO BE OUT OF BED WITHIN 4 HOURS OF ADMISSION TO YOUR PATIENT ROOM.  SITTING IN THE RECLINER THROUGHOUT THE DAY IS IMPORTANT FOR DRINKING FLUIDS AND MOVING GAS THROUGHOUT THE GI TRACT.  COMPRESSION STOCKINGS SHOULD BE WORN [place] UNLESS YOU ARE WALKING.   INCENTIVE SPIROMETER SHOULD BE USED EVERY HOUR WHILE AWAKE TO DECREASE POST-OPERATIVE COMPLICATIONS SUCH AS PNEUMONIA.  WHEN DISCHARGED HOME, IT IS IMPORTANT TO CONTINUE TO WALK EVERY HOUR AND USE THE INCENTIVE SPIROMETER EVERY HOUR.    Take these medicines the morning of surgery with A SIP OF WATER: Wellbutrin, Carvedilol, Amlodipine. Use your inhaler and bring with you. Bring mask and tubing       How to Manage Your Diabetes Before and After Surgery  Why is it important to control my blood sugar before and after surgery? . Improving blood sugar levels before and after surgery helps healing and can limit problems. . A way of improving blood sugar  control is eating a healthy diet by: o  Eating less sugar and carbohydrates o  Increasing activity/exercise o  Talking with your doctor about reaching your blood sugar goals . High blood sugars (greater than 180 mg/dL) can raise your risk of infections and slow your recovery, so you will need to focus on controlling your diabetes during the weeks before surgery. . Make sure that the doctor who takes care of your diabetes knows about your planned surgery including the date and location.  How do I manage my blood sugar before surgery? . Check your blood sugar at least 4 times a day, starting 2 days before surgery, to make sure that the level is not too high or low. o Check your blood sugar the morning of your surgery when you wake up and every 2 hours until you get to the Short Stay unit. . If your blood sugar is less than 70 mg/dL, you will need to treat for low blood sugar: o Do not take insulin. o Treat a low blood sugar (less than 70 mg/dL) with  cup of clear juice (cranberry or apple), 4 glucose tablets, OR glucose gel. o Recheck blood sugar in 15 minutes after treatment (to make sure it is greater than 70 mg/dL). If your blood sugar is not greater than 70  mg/dL on recheck, call [phone] for further instructions. . Report your blood sugar to the short stay nurse when you get to Short Stay.  . If you are admitted to the hospital after surgery: o Your blood sugar will be checked by the staff and you will probably be given insulin after surgery (instead of oral diabetes medicines) to make sure you have good blood sugar levels. o The goal for blood sugar control after surgery is 80-180 mg/dL.   WHAT DO I DO ABOUT MY DIABETES MEDICATION?  Marland Kitchen Do not take oral diabetes medicines (pills) the morning of surgery.                               You may not have any metal on your body including              piercings  Do not wear jewelry,  lotions, powders or deodorant                            Men may shave face and neck.   Do not bring valuables to the hospital. [place].  Contacts, dentures or bridgework may not be worn into surgery.                   Please read over the following fact sheets you were given: _____________________________________________________________________             [facility] - Preparing for Surgery Before surgery, you can play an important role.  Because skin is not sterile, your skin needs to be as free of germs as possible.  You can reduce the number of germs on your skin by washing with CHG (chlorahexidine gluconate) soap before surgery.  CHG is an antiseptic cleaner which kills germs and bonds with the skin to continue killing germs even after washing. Please DO NOT use if you have an allergy to CHG or antibacterial soaps.  If your skin becomes reddened/irritated stop using the CHG and inform your nurse when you arrive at Short Stay. Do not shave (including legs and underarms) for at least 48 hours prior to the first CHG shower.  You may shave your face/neck. Please follow these instructions carefully:  1.  Shower with CHG Soap the night before surgery and the  morning of Surgery.  2.  If you choose to wash your hair, wash your hair first as usual with your  normal  shampoo.  3.  After you shampoo, rinse your hair and body thoroughly to remove the  shampoo.                           4.  Use CHG as you would any other liquid soap.  You can apply chg directly  to the skin and wash                       Gently with a scrungie or clean washcloth.  5.  Apply the CHG Soap to your body ONLY FROM THE NECK DOWN.   Do not use on face/ open  Wound or open sores. Avoid contact with eyes, ears mouth and genitals (private parts).                       Wash face,  Genitals (private parts) with your normal soap.             6.  Wash thoroughly, paying special attention to the area where  your surgery  will be performed.  7.  Thoroughly rinse your body with warm water from the neck down.  8.  DO NOT shower/wash with your normal soap after using and rinsing off  the CHG Soap.                9.  Pat yourself dry with a clean towel.            10.  Wear clean pajamas.            11.  Place clean sheets on your bed the night of your first shower and do not  sleep with pets. Day of Surgery : Do not apply any lotions/deodorants the morning of surgery.  Please wear clean clothes to the hospital/surgery center.  FAILURE TO FOLLOW THESE INSTRUCTIONS MAY RESULT IN THE CANCELLATION OF YOUR SURGERY PATIENT SIGNATURE_________________________________  NURSE SIGNATURE__________________________________  ________________________________________________________________________   Adam Phenix  An incentive spirometer is a tool that can help keep your lungs clear and active. This tool measures how well you are filling your lungs with each breath. Taking long deep breaths may help reverse or decrease the chance of developing breathing (pulmonary) problems (especially infection) following:  A long period of time when you are unable to move or be active. BEFORE THE PROCEDURE   If the spirometer includes an indicator to show your best effort, your nurse or respiratory therapist will set it to a desired goal.  If possible, sit up straight or lean slightly forward. Try not to slouch.  Hold the incentive spirometer in an upright position. INSTRUCTIONS FOR USE  1. Sit on the edge of your bed if possible, or sit up as far as you can in bed or on a chair. 2. Hold the incentive spirometer in an upright position. 3. Breathe out normally. 4. Place the mouthpiece in your mouth and seal your lips tightly around it. 5. Breathe in slowly and as deeply as possible, raising the piston or the ball toward the top of the column. 6. Hold your breath for 3-5 seconds or for as long as possible. Allow  the piston or ball to fall to the bottom of the column. 7. Remove the mouthpiece from your mouth and breathe out normally. 8. Rest for a few seconds and repeat Steps 1 through 7 at least 10 times every 1-2 hours when you are awake. Take your time and take a few normal breaths between deep breaths. 9. The spirometer may include an indicator to show your best effort. Use the indicator as a goal to work toward during each repetition. 10. After each set of 10 deep breaths, practice coughing to be sure your lungs are clear. If you have an incision (the cut made at the time of surgery), support your incision when coughing by placing a pillow or rolled up towels firmly against it. Once you are able to get out of bed, walk around indoors and cough well. You may stop using the incentive spirometer when instructed by your caregiver.  RISKS AND COMPLICATIONS  Take your time so you do not  get dizzy or light-headed.  If you are in pain, you may need to take or ask for pain medication before doing incentive spirometry. It is harder to take a deep breath if you are having pain. AFTER USE  Rest and breathe slowly and easily.  It can be helpful to keep track of a log of your progress. Your caregiver can provide you with a simple table to help with this. If you are using the spirometer at home, follow these instructions: [place] IF:   You are having difficultly using the spirometer.  You have trouble using the spirometer as often as instructed.  Your pain medication is not giving enough relief while using the spirometer.  You develop fever of 100.5 F (38.1 C) or higher. SEEK IMMEDIATE MEDICAL CARE IF:   You cough up bloody sputum that had not been present before.  You develop fever of 102 F (38.9 C) or greater.  You develop worsening pain at or near the incision site. MAKE SURE YOU:   Understand these instructions.  Will watch your condition.  Will get help right away if you are  not doing well or get worse. Document Released: 05/05/2006 Document Revised: 03/17/2011 Document Reviewed: 07/06/2006 [facility] Patient Information 2014 [place], [place].   ________________________________________________________________________

## 2020-02-16 ENCOUNTER — Encounter (HOSPITAL_COMMUNITY): Payer: Self-pay

## 2020-02-16 ENCOUNTER — Encounter (HOSPITAL_COMMUNITY)
Admission: RE | Admit: 2020-02-16 | Discharge: 2020-02-16 | Disposition: A | Discharge status: Home / Self Care | Payer: 59 | Source: Ambulatory Visit | Attending: Surgery | Admitting: Surgery

## 2020-02-16 ENCOUNTER — Other Ambulatory Visit: Payer: Self-pay

## 2020-02-16 DIAGNOSIS — Z01812 Encounter for preprocedural laboratory examination: Secondary | ICD-10-CM | POA: Diagnosis not present

## 2020-02-16 HISTORY — DX: Anxiety disorder, unspecified: F41.9

## 2020-02-16 HISTORY — DX: Depression, unspecified: F32.A

## 2020-02-16 HISTORY — DX: Unspecified osteoarthritis, unspecified site: M19.90

## 2020-02-16 LAB — CBC WITH DIFFERENTIAL/PLATELET
Abs Immature Granulocytes: 0.03 10*3/uL (ref 0.00–0.07)
Basophils Absolute: 0.1 10*3/uL (ref 0.0–0.1)
Basophils Relative: 1 %
Eosinophils Absolute: 0.5 10*3/uL (ref 0.0–0.5)
Eosinophils Relative: 6 %
HCT: 45.8 % (ref 39.0–52.0)
Hemoglobin: 14.3 g/dL (ref 13.0–17.0)
Immature Granulocytes: 0 %
Lymphocytes Relative: 23 %
Lymphs Abs: 1.9 10*3/uL (ref 0.7–4.0)
MCH: 26.6 pg (ref 26.0–34.0)
MCHC: 31.2 g/dL (ref 30.0–36.0)
MCV: 85.3 fL (ref 80.0–100.0)
Monocytes Absolute: 0.7 10*3/uL (ref 0.1–1.0)
Monocytes Relative: 9 %
Neutro Abs: 4.9 10*3/uL (ref 1.7–7.7)
Neutrophils Relative %: 61 %
Platelets: 237 10*3/uL (ref 150–400)
RBC: 5.37 MIL/uL (ref 4.22–5.81)
RDW: 15.1 % (ref 11.5–15.5)
WBC: 8 10*3/uL (ref 4.0–10.5)
nRBC: 0 % (ref 0.0–0.2)

## 2020-02-16 LAB — COMPREHENSIVE METABOLIC PANEL
ALT: 34 U/L (ref 0–44)
AST: 21 U/L (ref 15–41)
Albumin: 4.2 g/dL (ref 3.5–5.0)
Alkaline Phosphatase: 75 U/L (ref 38–126)
Anion gap: 11 (ref 5–15)
BUN: 26 mg/dL — ABNORMAL HIGH (ref 6–20)
CO2: 24 mmol/L (ref 22–32)
Calcium: 9 mg/dL (ref 8.9–10.3)
Chloride: 104 mmol/L (ref 98–111)
Creatinine, Ser: 1.45 mg/dL — ABNORMAL HIGH (ref 0.61–1.24)
GFR, Estimated: 59 mL/min — ABNORMAL LOW (ref >60)
Glucose, Bld: 159 mg/dL — ABNORMAL HIGH (ref 70–99)
Potassium: 4.2 mmol/L (ref 3.5–5.1)
Sodium: 139 mmol/L (ref 135–145)
Total Bilirubin: 0.8 mg/dL (ref 0.3–1.2)
Total Protein: 7.3 g/dL (ref 6.5–8.1)

## 2020-02-16 LAB — TYPE AND SCREEN
ABO/RH(D): O POS
Antibody Screen: NEGATIVE

## 2020-02-16 LAB — HEMOGLOBIN A1C
Hgb A1c MFr Bld: 7.8 % — ABNORMAL HIGH (ref 4.8–5.6)
Mean Plasma Glucose: 177.16 mg/dL

## 2020-02-16 LAB — GLUCOSE, CAPILLARY: Glucose-Capillary: 158 mg/dL — ABNORMAL HIGH (ref 70–99)

## 2020-02-16 NOTE — Progress Notes (Signed)
COVID Vaccine Completed:yes Date COVID Vaccine completed:02/11/19 COVID vaccine manufacturer: [place]      PCP - Dr. Doristine Section Cardiologist - no  Chest x-ray - 04/01/19-epic EKG - 12/14/19-epic Stress Test - no ECHO - no Cardiac Cath - no Pacemaker/ICD device last checked:NA  Sleep Study - yes CPAP - yes  Fasting Blood Sugar - Pt not sure 112-200 Checks Blood Sugar _____ times a day a few times a month  Blood Thinner Instructions:NA Aspirin Instructions: Last Dose:  Anesthesia review:   Patient denies shortness of breath, fever, cough and chest pain at PAT appointment yes  Patient verbalized understanding of instructions that were given to them at the PAT appointment. Patient was also instructed that they will need to review over the PAT instructions again at home before surgery.yes Pt had Covid in Jan 2022. He will get the documentation for the chart His BP was high 190/100 at PAT. He will monitor it and call his MD.

## 2020-02-23 ENCOUNTER — Other Ambulatory Visit (HOSPITAL_COMMUNITY): Payer: 59

## 2020-02-25 NOTE — Anesthesia Preprocedure Evaluation (Addendum)
Anesthesia Evaluation  Patient identified by MRN, date of birth, ID band Patient awake    Reviewed: Allergy & Precautions, NPO status , Patient's Chart, lab work & pertinent test results  Airway Mallampati: III  TM Distance: >3 FB Neck ROM: Full    Dental  (+) Teeth Intact   Pulmonary asthma , sleep apnea and Continuous Positive Airway Pressure Ventilation , Patient abstained from smoking., former smoker,    Pulmonary exam normal        Cardiovascular hypertension, Pt. on medications  Rhythm:Regular Rate:Normal     Neuro/Psych Anxiety Depression negative neurological ROS     GI/Hepatic negative GI ROS, Neg liver ROS,   Endo/Other  diabetes, Type 2, Oral Hypoglycemic AgentsMorbid obesity  Renal/GU   negative genitourinary   Musculoskeletal  (+) Arthritis , Osteoarthritis,    Abdominal (+) + obese,  Abdomen: soft. Bowel sounds: normal.  Peds  Hematology negative hematology ROS (+)   Anesthesia Other Findings   Reproductive/Obstetrics                            Anesthesia Physical Anesthesia Plan  ASA: III  Anesthesia Plan: General   Post-op Pain Management:    Induction: Intravenous  PONV Risk Score and Plan: 2 and Ondansetron, Aprepitant, Midazolam, Treatment may vary due to age or medical condition and Dexamethasone  Airway Management Planned: Mask and Oral ETT  Additional Equipment: None  Intra-op Plan:   Post-operative Plan: Extubation in OR  Informed Consent: I have reviewed the patients History and Physical, chart, labs and discussed the procedure including the risks, benefits and alternatives for the proposed anesthesia with the patient or authorized representative who has indicated his/her understanding and acceptance.     Dental advisory given  Plan Discussed with: CRNA  Anesthesia Plan Comments: (Lab Results      Component                Value                Date                      WBC                      8.0                 02/16/2020                HGB                      14.3                02/16/2020                HCT                      45.8                02/16/2020                MCV                      85.3                02/16/2020                PLT  237                 02/16/2020           Lab Results      Component                Value               Date                      NA                       139                 02/16/2020                K                        4.2                 02/16/2020                CO2                      24                  02/16/2020                GLUCOSE                  159 (H)             02/16/2020                BUN                      26 (H)              02/16/2020                CREATININE               1.45 (H)            02/16/2020                CALCIUM                  9.0                 02/16/2020                GFRNONAA                 59 (L)              02/16/2020                GFRAA                    67                  12/14/2019          )       Anesthesia Quick Evaluation

## 2020-02-26 MED ORDER — BUPIVACAINE LIPOSOME 1.3 % IJ SUSP
20.0000 mL | Freq: Once | INTRAMUSCULAR | Status: DC
  Filled 2020-02-26: qty 20

## 2020-02-27 ENCOUNTER — Inpatient Hospital Stay (HOSPITAL_COMMUNITY): Payer: 59 | Admitting: Anesthesiology

## 2020-02-27 ENCOUNTER — Encounter (HOSPITAL_COMMUNITY)
Admission: RE | Disposition: A | Discharge status: Home / Self Care | Payer: Self-pay | Source: Home / Self Care | Attending: Surgery

## 2020-02-27 ENCOUNTER — Inpatient Hospital Stay (HOSPITAL_COMMUNITY)
Admission: RE | Admit: 2020-02-27 | Discharge: 2020-02-28 | DRG: 621 | Disposition: A | Discharge status: Home / Self Care | Payer: 59 | Attending: Surgery | Admitting: Surgery

## 2020-02-27 ENCOUNTER — Encounter (HOSPITAL_COMMUNITY): Payer: Self-pay | Admitting: Surgery

## 2020-02-27 ENCOUNTER — Other Ambulatory Visit: Payer: Self-pay

## 2020-02-27 DIAGNOSIS — Z79899 Other long term (current) drug therapy: Secondary | ICD-10-CM

## 2020-02-27 DIAGNOSIS — Z825 Family history of asthma and other chronic lower respiratory diseases: Secondary | ICD-10-CM

## 2020-02-27 DIAGNOSIS — J45909 Unspecified asthma, uncomplicated: Secondary | ICD-10-CM | POA: Diagnosis present

## 2020-02-27 DIAGNOSIS — I10 Essential (primary) hypertension: Secondary | ICD-10-CM | POA: Diagnosis present

## 2020-02-27 DIAGNOSIS — G4733 Obstructive sleep apnea (adult) (pediatric): Secondary | ICD-10-CM | POA: Diagnosis present

## 2020-02-27 DIAGNOSIS — Z6841 Body Mass Index (BMI) 40.0 and over, adult: Secondary | ICD-10-CM | POA: Diagnosis not present

## 2020-02-27 DIAGNOSIS — E119 Type 2 diabetes mellitus without complications: Secondary | ICD-10-CM | POA: Diagnosis present

## 2020-02-27 DIAGNOSIS — Z8249 Family history of ischemic heart disease and other diseases of the circulatory system: Secondary | ICD-10-CM

## 2020-02-27 DIAGNOSIS — K219 Gastro-esophageal reflux disease without esophagitis: Secondary | ICD-10-CM | POA: Diagnosis present

## 2020-02-27 DIAGNOSIS — Z87891 Personal history of nicotine dependence: Secondary | ICD-10-CM | POA: Diagnosis not present

## 2020-02-27 DIAGNOSIS — Z7984 Long term (current) use of oral hypoglycemic drugs: Secondary | ICD-10-CM | POA: Diagnosis not present

## 2020-02-27 DIAGNOSIS — Z91013 Allergy to seafood: Secondary | ICD-10-CM | POA: Diagnosis not present

## 2020-02-27 DIAGNOSIS — F419 Anxiety disorder, unspecified: Secondary | ICD-10-CM | POA: Diagnosis present

## 2020-02-27 HISTORY — PX: UPPER GI ENDOSCOPY: SHX6162

## 2020-02-27 LAB — GLUCOSE, CAPILLARY
Glucose-Capillary: 152 mg/dL — ABNORMAL HIGH (ref 70–99)
Glucose-Capillary: 157 mg/dL — ABNORMAL HIGH (ref 70–99)
Glucose-Capillary: 161 mg/dL — ABNORMAL HIGH (ref 70–99)
Glucose-Capillary: 168 mg/dL — ABNORMAL HIGH (ref 70–99)
Glucose-Capillary: 210 mg/dL — ABNORMAL HIGH (ref 70–99)

## 2020-02-27 LAB — ABO/RH: ABO/RH(D): O POS

## 2020-02-27 SURGERY — XI ROBOTIC GASTRIC SLEEVE RESECTION
Anesthesia: General | Site: Abdomen

## 2020-02-27 MED ORDER — HYDRALAZINE HCL 20 MG/ML IJ SOLN: 10.0000 mg | INTRAMUSCULAR | Status: DC | PRN

## 2020-02-27 MED ORDER — 0.9 % SODIUM CHLORIDE (POUR BTL) OPTIME
TOPICAL | Status: DC | PRN
  Administered 2020-02-27: 1000 mL

## 2020-02-27 MED ORDER — SODIUM CHLORIDE 0.9 % IV SOLN: INTRAVENOUS | Status: DC

## 2020-02-27 MED ORDER — LIDOCAINE HCL 2 % IJ SOLN
INTRAMUSCULAR | Status: AC
  Filled 2020-02-27: qty 20

## 2020-02-27 MED ORDER — INSULIN ASPART 100 UNIT/ML ~~LOC~~ SOLN
0.0000 [IU] | SUBCUTANEOUS | Status: DC
  Administered 2020-02-27: 4 [IU] via SUBCUTANEOUS
  Administered 2020-02-27: 7 [IU] via SUBCUTANEOUS
  Administered 2020-02-27: 4 [IU] via SUBCUTANEOUS
  Administered 2020-02-28 (×2): 3 [IU] via SUBCUTANEOUS

## 2020-02-27 MED ORDER — DEXAMETHASONE SODIUM PHOSPHATE 10 MG/ML IJ SOLN
INTRAMUSCULAR | Status: DC | PRN
  Administered 2020-02-27: 8 mg via INTRAVENOUS

## 2020-02-27 MED ORDER — BUPIVACAINE-EPINEPHRINE 0.25% -1:200000 IJ SOLN
INTRAMUSCULAR | Status: DC | PRN
  Administered 2020-02-27: 30 mL

## 2020-02-27 MED ORDER — METHOCARBAMOL 500 MG IVPB - SIMPLE MED
500.0000 mg | Freq: Four times a day (QID) | INTRAVENOUS | Status: DC | PRN
  Administered 2020-02-27: 500 mg via INTRAVENOUS
  Filled 2020-02-27: qty 500
  Filled 2020-02-27 (×2): qty 50

## 2020-02-27 MED ORDER — GABAPENTIN 100 MG PO CAPS
200.0000 mg | ORAL_CAPSULE | Freq: Two times a day (BID) | ORAL | Status: DC
  Administered 2020-02-27 – 2020-02-28 (×3): 200 mg via ORAL
  Filled 2020-02-27 (×3): qty 2

## 2020-02-27 MED ORDER — AMLODIPINE BESYLATE 10 MG PO TABS
10.0000 mg | ORAL_TABLET | Freq: Every evening | ORAL | Status: DC
  Administered 2020-02-27: 10 mg via ORAL
  Filled 2020-02-27: qty 1

## 2020-02-27 MED ORDER — SCOPOLAMINE 1 MG/3DAYS TD PT72: 1.0000 | MEDICATED_PATCH | TRANSDERMAL | Status: DC

## 2020-02-27 MED ORDER — SUCCINYLCHOLINE CHLORIDE 200 MG/10ML IV SOSY
PREFILLED_SYRINGE | INTRAVENOUS | Status: AC
  Filled 2020-02-27: qty 10

## 2020-02-27 MED ORDER — ENSURE MAX PROTEIN PO LIQD
2.0000 [oz_av] | ORAL | Status: DC
  Administered 2020-02-28 (×4): 2 [oz_av] via ORAL

## 2020-02-27 MED ORDER — BUPIVACAINE LIPOSOME 1.3 % IJ SUSP
INTRAMUSCULAR | Status: DC | PRN
  Administered 2020-02-27: 20 mL

## 2020-02-27 MED ORDER — KETAMINE HCL 10 MG/ML IJ SOLN
INTRAMUSCULAR | Status: DC | PRN
  Administered 2020-02-27: 50 mg via INTRAVENOUS
  Administered 2020-02-27: 20 mg via INTRAVENOUS

## 2020-02-27 MED ORDER — HYDROMORPHONE HCL 1 MG/ML IJ SOLN
0.2500 mg | INTRAMUSCULAR | Status: DC | PRN
Start: 2020-02-27 — End: 2020-02-27
  Administered 2020-02-27 (×4): 0.5 mg via INTRAVENOUS

## 2020-02-27 MED ORDER — HYDROMORPHONE HCL 2 MG/ML IJ SOLN
INTRAMUSCULAR | Status: AC
  Filled 2020-02-27: qty 1

## 2020-02-27 MED ORDER — SODIUM CHLORIDE (PF) 0.9 % IJ SOLN
INTRAMUSCULAR | Status: AC
  Filled 2020-02-27: qty 10

## 2020-02-27 MED ORDER — APREPITANT 40 MG PO CAPS
ORAL_CAPSULE | ORAL | Status: AC
  Administered 2020-02-27: 40 mg via ORAL
  Filled 2020-02-27: qty 1

## 2020-02-27 MED ORDER — STERILE WATER FOR IRRIGATION IR SOLN
Status: DC | PRN
  Administered 2020-02-27: 1000 mL

## 2020-02-27 MED ORDER — HEPARIN SODIUM (PORCINE) 5000 UNIT/ML IJ SOLN
INTRAMUSCULAR | Status: AC
  Administered 2020-02-27: 5000 [IU] via SUBCUTANEOUS
  Filled 2020-02-27: qty 1

## 2020-02-27 MED ORDER — OXYCODONE HCL 5 MG/5ML PO SOLN
5.0000 mg | Freq: Once | ORAL | Status: DC | PRN
Start: 2020-02-27 — End: 2020-02-27

## 2020-02-27 MED ORDER — PANTOPRAZOLE SODIUM 40 MG IV SOLR
40.0000 mg | Freq: Every day | INTRAVENOUS | Status: DC
  Administered 2020-02-27: 40 mg via INTRAVENOUS
  Filled 2020-02-27: qty 40

## 2020-02-27 MED ORDER — PHENYLEPHRINE HCL-NACL 10-0.9 MG/250ML-% IV SOLN
INTRAVENOUS | Status: DC | PRN
  Administered 2020-02-27: 40 ug/min via INTRAVENOUS

## 2020-02-27 MED ORDER — MIDAZOLAM HCL 5 MG/5ML IJ SOLN
INTRAMUSCULAR | Status: DC | PRN
  Administered 2020-02-27 (×2): 2 mg via INTRAVENOUS

## 2020-02-27 MED ORDER — PHENYLEPHRINE HCL (PRESSORS) 10 MG/ML IV SOLN
INTRAVENOUS | Status: DC | PRN
  Administered 2020-02-27: 80 ug via INTRAVENOUS
  Administered 2020-02-27: 60 ug via INTRAVENOUS
  Administered 2020-02-27: 120 ug via INTRAVENOUS
  Administered 2020-02-27: 80 ug via INTRAVENOUS

## 2020-02-27 MED ORDER — SODIUM CHLORIDE 0.9 % IV SOLN
INTRAVENOUS | Status: AC
  Filled 2020-02-27: qty 2

## 2020-02-27 MED ORDER — LACTATED RINGERS IR SOLN
Status: DC | PRN
  Administered 2020-02-27: 1000 mL

## 2020-02-27 MED ORDER — ROCURONIUM BROMIDE 10 MG/ML (PF) SYRINGE
PREFILLED_SYRINGE | INTRAVENOUS | Status: AC
  Filled 2020-02-27: qty 10

## 2020-02-27 MED ORDER — LIDOCAINE HCL (PF) 2 % IJ SOLN
INTRAMUSCULAR | Status: AC
  Filled 2020-02-27: qty 5

## 2020-02-27 MED ORDER — DOCUSATE SODIUM 100 MG PO CAPS
100.0000 mg | ORAL_CAPSULE | Freq: Two times a day (BID) | ORAL | Status: DC
  Administered 2020-02-27 – 2020-02-28 (×3): 100 mg via ORAL
  Filled 2020-02-27 (×3): qty 1

## 2020-02-27 MED ORDER — ONDANSETRON HCL 4 MG/2ML IJ SOLN
INTRAMUSCULAR | Status: AC
  Filled 2020-02-27: qty 2

## 2020-02-27 MED ORDER — ACETAMINOPHEN 500 MG PO TABS
ORAL_TABLET | ORAL | Status: AC
  Administered 2020-02-27: 1000 mg via ORAL
  Filled 2020-02-27: qty 2

## 2020-02-27 MED ORDER — OXYCODONE HCL 5 MG/5ML PO SOLN
5.0000 mg | Freq: Four times a day (QID) | ORAL | Status: DC | PRN
  Administered 2020-02-27 – 2020-02-28 (×3): 5 mg via ORAL
  Filled 2020-02-27 (×2): qty 5

## 2020-02-27 MED ORDER — LIDOCAINE 2% (20 MG/ML) 5 ML SYRINGE
INTRAMUSCULAR | Status: DC | PRN
  Administered 2020-02-27: 100 mg via INTRAVENOUS

## 2020-02-27 MED ORDER — SUGAMMADEX SODIUM 500 MG/5ML IV SOLN
INTRAVENOUS | Status: AC
  Filled 2020-02-27: qty 5

## 2020-02-27 MED ORDER — PHENYLEPHRINE 40 MCG/ML (10ML) SYRINGE FOR IV PUSH (FOR BLOOD PRESSURE SUPPORT)
PREFILLED_SYRINGE | INTRAVENOUS | Status: AC
  Filled 2020-02-27: qty 10

## 2020-02-27 MED ORDER — SIMETHICONE 80 MG PO CHEW
80.0000 mg | CHEWABLE_TABLET | Freq: Four times a day (QID) | ORAL | Status: DC | PRN
Start: 2020-02-27 — End: 2020-02-28

## 2020-02-27 MED ORDER — OXYCODONE HCL 5 MG/5ML PO SOLN
ORAL | Status: AC
  Filled 2020-02-27: qty 5

## 2020-02-27 MED ORDER — ORAL CARE MOUTH RINSE: 15.0000 mL | Freq: Once | OROMUCOSAL | Status: AC

## 2020-02-27 MED ORDER — SUCCINYLCHOLINE CHLORIDE 200 MG/10ML IV SOSY
PREFILLED_SYRINGE | INTRAVENOUS | Status: DC | PRN
  Administered 2020-02-27: 160 mg via INTRAVENOUS

## 2020-02-27 MED ORDER — APREPITANT 40 MG PO CAPS: 40.0000 mg | ORAL_CAPSULE | ORAL | Status: AC

## 2020-02-27 MED ORDER — CHLORHEXIDINE GLUCONATE 4 % EX LIQD
60.0000 mL | Freq: Once | CUTANEOUS | Status: DC
  Administered 2020-02-27: 4 via TOPICAL

## 2020-02-27 MED ORDER — SCOPOLAMINE 1 MG/3DAYS TD PT72
MEDICATED_PATCH | TRANSDERMAL | Status: AC
  Administered 2020-02-27: 1.5 mg via TRANSDERMAL
  Filled 2020-02-27: qty 1

## 2020-02-27 MED ORDER — ENOXAPARIN SODIUM 30 MG/0.3ML ~~LOC~~ SOLN
30.0000 mg | Freq: Two times a day (BID) | SUBCUTANEOUS | Status: DC
  Administered 2020-02-27: 30 mg via SUBCUTANEOUS
  Filled 2020-02-27: qty 0.3

## 2020-02-27 MED ORDER — GABAPENTIN 300 MG PO CAPS
ORAL_CAPSULE | ORAL | Status: AC
  Administered 2020-02-27: 300 mg via ORAL
  Filled 2020-02-27: qty 1

## 2020-02-27 MED ORDER — HEPARIN SODIUM (PORCINE) 5000 UNIT/ML IJ SOLN: 5000.0000 [IU] | INTRAMUSCULAR | Status: AC

## 2020-02-27 MED ORDER — BUPIVACAINE-EPINEPHRINE (PF) 0.25% -1:200000 IJ SOLN
INTRAMUSCULAR | Status: AC
  Filled 2020-02-27: qty 30

## 2020-02-27 MED ORDER — CHLORHEXIDINE GLUCONATE 4 % EX LIQD: 60.0000 mL | Freq: Once | CUTANEOUS | Status: DC

## 2020-02-27 MED ORDER — HYDROMORPHONE HCL 1 MG/ML IJ SOLN
0.5000 mg | INTRAMUSCULAR | Status: DC | PRN
  Administered 2020-02-27: 0.5 mg via INTRAVENOUS
  Filled 2020-02-27: qty 0.5

## 2020-02-27 MED ORDER — MIDAZOLAM HCL 2 MG/2ML IJ SOLN
INTRAMUSCULAR | Status: AC
  Filled 2020-02-27: qty 4

## 2020-02-27 MED ORDER — TRAMADOL HCL 50 MG PO TABS
50.0000 mg | ORAL_TABLET | Freq: Four times a day (QID) | ORAL | Status: DC | PRN
  Administered 2020-02-27: 50 mg via ORAL
  Filled 2020-02-27: qty 1

## 2020-02-27 MED ORDER — EPHEDRINE SULFATE 50 MG/ML IJ SOLN
INTRAMUSCULAR | Status: DC | PRN
  Administered 2020-02-27: 10 mg via INTRAVENOUS
  Administered 2020-02-27 (×2): 5 mg via INTRAVENOUS

## 2020-02-27 MED ORDER — SUFENTANIL CITRATE 50 MCG/ML IV SOLN
INTRAVENOUS | Status: DC | PRN
  Administered 2020-02-27: 10 ug via INTRAVENOUS
  Administered 2020-02-27: 15 ug via INTRAVENOUS
  Administered 2020-02-27 (×2): 10 ug via INTRAVENOUS
  Administered 2020-02-27: 5 ug via INTRAVENOUS

## 2020-02-27 MED ORDER — FENTANYL CITRATE (PF) 100 MCG/2ML IJ SOLN: INTRAMUSCULAR | Status: DC | PRN

## 2020-02-27 MED ORDER — DIPHENHYDRAMINE HCL 25 MG PO CAPS
50.0000 mg | ORAL_CAPSULE | Freq: Every evening | ORAL | Status: DC | PRN
  Filled 2020-02-27: qty 2

## 2020-02-27 MED ORDER — PROPOFOL 500 MG/50ML IV EMUL
INTRAVENOUS | Status: DC | PRN
  Administered 2020-02-27: 25 ug/kg/min via INTRAVENOUS

## 2020-02-27 MED ORDER — DEXAMETHASONE SODIUM PHOSPHATE 10 MG/ML IJ SOLN
INTRAMUSCULAR | Status: AC
  Filled 2020-02-27: qty 1

## 2020-02-27 MED ORDER — ONDANSETRON HCL 4 MG/2ML IJ SOLN
INTRAMUSCULAR | Status: DC | PRN
  Administered 2020-02-27: 4 mg via INTRAVENOUS

## 2020-02-27 MED ORDER — CLONIDINE HCL 0.1 MG PO TABS
0.3000 mg | ORAL_TABLET | Freq: Every evening | ORAL | Status: DC
  Administered 2020-02-27: 0.3 mg via ORAL
  Filled 2020-02-27 (×2): qty 3

## 2020-02-27 MED ORDER — ONDANSETRON HCL 4 MG/2ML IJ SOLN: 4.0000 mg | INTRAMUSCULAR | Status: DC | PRN

## 2020-02-27 MED ORDER — HYDROMORPHONE HCL 1 MG/ML IJ SOLN
INTRAMUSCULAR | Status: AC
  Filled 2020-02-27: qty 1

## 2020-02-27 MED ORDER — PHENYLEPHRINE HCL (PRESSORS) 10 MG/ML IV SOLN
INTRAVENOUS | Status: AC
  Filled 2020-02-27: qty 1

## 2020-02-27 MED ORDER — PROPOFOL 10 MG/ML IV BOLUS
INTRAVENOUS | Status: AC
  Filled 2020-02-27: qty 40

## 2020-02-27 MED ORDER — SUGAMMADEX SODIUM 500 MG/5ML IV SOLN
INTRAVENOUS | Status: DC | PRN
  Administered 2020-02-27: 400 mg via INTRAVENOUS

## 2020-02-27 MED ORDER — ROCURONIUM BROMIDE 10 MG/ML (PF) SYRINGE
PREFILLED_SYRINGE | INTRAVENOUS | Status: DC | PRN
  Administered 2020-02-27: 20 mg via INTRAVENOUS
  Administered 2020-02-27: 10 mg via INTRAVENOUS
  Administered 2020-02-27: 60 mg via INTRAVENOUS
  Administered 2020-02-27: 20 mg via INTRAVENOUS

## 2020-02-27 MED ORDER — GABAPENTIN 300 MG PO CAPS: 300.0000 mg | ORAL_CAPSULE | ORAL | Status: AC

## 2020-02-27 MED ORDER — CARVEDILOL 25 MG PO TABS
25.0000 mg | ORAL_TABLET | Freq: Two times a day (BID) | ORAL | Status: DC
  Administered 2020-02-27 – 2020-02-28 (×2): 25 mg via ORAL
  Filled 2020-02-27 (×2): qty 1

## 2020-02-27 MED ORDER — PROPOFOL 1000 MG/100ML IV EMUL
INTRAVENOUS | Status: AC
  Filled 2020-02-27: qty 100

## 2020-02-27 MED ORDER — LACTATED RINGERS IV SOLN: INTRAVENOUS | Status: DC

## 2020-02-27 MED ORDER — PROPOFOL 10 MG/ML IV BOLUS
INTRAVENOUS | Status: DC | PRN
  Administered 2020-02-27: 300 mg via INTRAVENOUS

## 2020-02-27 MED ORDER — ALBUTEROL SULFATE HFA 108 (90 BASE) MCG/ACT IN AERS
2.0000 | INHALATION_SPRAY | Freq: Four times a day (QID) | RESPIRATORY_TRACT | Status: DC | PRN
  Filled 2020-02-27: qty 6.7

## 2020-02-27 MED ORDER — BUPROPION HCL ER (XL) 150 MG PO TB24
150.0000 mg | ORAL_TABLET | Freq: Every day | ORAL | Status: DC
  Administered 2020-02-28: 150 mg via ORAL
  Filled 2020-02-27: qty 1

## 2020-02-27 MED ORDER — ACETAMINOPHEN 500 MG PO TABS: 1000.0000 mg | ORAL_TABLET | ORAL | Status: AC

## 2020-02-27 MED ORDER — ACETAMINOPHEN 160 MG/5ML PO SOLN: 1000.0000 mg | Freq: Three times a day (TID) | ORAL | Status: DC

## 2020-02-27 MED ORDER — KETAMINE HCL 10 MG/ML IJ SOLN
INTRAMUSCULAR | Status: AC
  Filled 2020-02-27: qty 1

## 2020-02-27 MED ORDER — METOCLOPRAMIDE HCL 5 MG/ML IJ SOLN
10.0000 mg | Freq: Four times a day (QID) | INTRAMUSCULAR | Status: DC
  Administered 2020-02-27 – 2020-02-28 (×5): 10 mg via INTRAVENOUS
  Filled 2020-02-27 (×5): qty 2

## 2020-02-27 MED ORDER — SODIUM CHLORIDE 0.9 % IV SOLN
2.0000 g | INTRAVENOUS | Status: AC
  Administered 2020-02-27: 2 mg via INTRAVENOUS

## 2020-02-27 MED ORDER — EPHEDRINE 5 MG/ML INJ
INTRAVENOUS | Status: AC
  Filled 2020-02-27: qty 10

## 2020-02-27 MED ORDER — OXYCODONE HCL 5 MG PO TABS: 5.0000 mg | ORAL_TABLET | Freq: Once | ORAL | Status: DC | PRN

## 2020-02-27 MED ORDER — LIDOCAINE 2% (20 MG/ML) 5 ML SYRINGE
INTRAMUSCULAR | Status: DC | PRN
  Administered 2020-02-27: 1.5 mg/kg/h via INTRAVENOUS

## 2020-02-27 MED ORDER — PROMETHAZINE HCL 25 MG/ML IJ SOLN: 6.2500 mg | INTRAMUSCULAR | Status: DC | PRN

## 2020-02-27 MED ORDER — ACETAMINOPHEN 500 MG PO TABS
1000.0000 mg | ORAL_TABLET | Freq: Three times a day (TID) | ORAL | Status: DC
  Administered 2020-02-27 – 2020-02-28 (×4): 1000 mg via ORAL
  Filled 2020-02-27 (×4): qty 2

## 2020-02-27 MED ORDER — CHLORHEXIDINE GLUCONATE 0.12 % MT SOLN
15.0000 mL | Freq: Once | OROMUCOSAL | Status: AC
  Administered 2020-02-27: 15 mL via OROMUCOSAL

## 2020-02-27 MED ORDER — SUFENTANIL CITRATE 50 MCG/ML IV SOLN
INTRAVENOUS | Status: AC
  Filled 2020-02-27: qty 1

## 2020-02-27 MED ORDER — METOPROLOL TARTRATE 5 MG/5ML IV SOLN
5.0000 mg | Freq: Four times a day (QID) | INTRAVENOUS | Status: DC | PRN
  Filled 2020-02-27: qty 5

## 2020-02-27 SURGICAL SUPPLY — 66 items
APPLIER CLIP 5 13 M/L LIGAMAX5 (MISCELLANEOUS)
APPLIER CLIP ROT 10 11.4 M/L (STAPLE)
BLADE SURG SZ11 CARB STEEL (BLADE) ×3 IMPLANT
CANNULA REDUC XI 12-8 STAPL (CANNULA) ×3
CANNULA REDUCER 12-8 DVNC XI (CANNULA) ×2 IMPLANT
CHLORAPREP W/TINT 26 (MISCELLANEOUS) ×3 IMPLANT
CLIP APPLIE 5 13 M/L LIGAMAX5 (MISCELLANEOUS) IMPLANT
CLIP APPLIE ROT 10 11.4 M/L (STAPLE) IMPLANT
COVER MAYO STAND STRL (DRAPES) ×3 IMPLANT
COVER SURGICAL LIGHT HANDLE (MISCELLANEOUS) ×3 IMPLANT
COVER WAND RF STERILE (DRAPES) ×3 IMPLANT
DECANTER SPIKE VIAL GLASS SM (MISCELLANEOUS) ×3 IMPLANT
DERMABOND ADVANCED (GAUZE/BANDAGES/DRESSINGS) ×1
DERMABOND ADVANCED .7 DNX12 (GAUZE/BANDAGES/DRESSINGS) ×2 IMPLANT
DRAPE 3/4 80X56 (DRAPES) ×3 IMPLANT
DRAPE ARM DVNC X/XI (DISPOSABLE) ×6 IMPLANT
DRAPE COLUMN DVNC XI (DISPOSABLE) IMPLANT
DRAPE DA VINCI XI ARM (DISPOSABLE) ×9
DRAPE DA VINCI XI COLUMN (DISPOSABLE)
ELECT REM PT RETURN 15FT ADLT (MISCELLANEOUS) ×3 IMPLANT
ENDOLOOP SUT PDS II  0 18 (SUTURE)
ENDOLOOP SUT PDS II 0 18 (SUTURE) IMPLANT
GAUZE 4X4 16PLY RFD (DISPOSABLE) ×3 IMPLANT
GLOVE SURG ENC MOIS LTX SZ6 (GLOVE) ×9 IMPLANT
GLOVE SURG UNDER LTX SZ6.5 (GLOVE) ×9 IMPLANT
GOWN STRL REUS W/TWL LRG LVL3 (GOWN DISPOSABLE) ×9 IMPLANT
GOWN STRL REUS W/TWL XL LVL3 (GOWN DISPOSABLE) IMPLANT
GRASPER SUT TROCAR 14GX15 (MISCELLANEOUS) ×3 IMPLANT
KIT BASIN OR (CUSTOM PROCEDURE TRAY) ×3 IMPLANT
MARKER SKIN DUAL TIP RULER LAB (MISCELLANEOUS) ×3 IMPLANT
MAT PREVALON FULL STRYKER (MISCELLANEOUS) ×3 IMPLANT
NEEDLE INSUFFLATION 14GA 120MM (NEEDLE) ×3 IMPLANT
NEEDLE SPNL 22GX3.5 QUINCKE BK (NEEDLE) ×3 IMPLANT
PACK CARDIOVASCULAR III (CUSTOM PROCEDURE TRAY) ×3 IMPLANT
RELOAD STAPLER 3.5X60 BLU DVNC (STAPLE) ×12 IMPLANT
RELOAD STAPLER 4.3X60 GRN DVNC (STAPLE) ×2 IMPLANT
SCISSORS LAP 5X35 DISP (ENDOMECHANICALS) IMPLANT
SEAL CANN UNIV 5-8 DVNC XI (MISCELLANEOUS) ×4 IMPLANT
SEAL XI 5MM-8MM UNIVERSAL (MISCELLANEOUS) ×6
SEALER VESSEL DA VINCI XI (MISCELLANEOUS) ×3
SEALER VESSEL EXT DVNC XI (MISCELLANEOUS) ×2 IMPLANT
SET IRRIG TUBING LAPAROSCOPIC (IRRIGATION / IRRIGATOR) ×3 IMPLANT
SLEEVE GASTRECTOMY 40FR VISIGI (MISCELLANEOUS) ×3 IMPLANT
SOL ANTI FOG 6CC (MISCELLANEOUS) ×2 IMPLANT
SOLUTION ANTI FOG 6CC (MISCELLANEOUS) ×1
SOLUTION ELECTROLUBE (MISCELLANEOUS) ×3 IMPLANT
STAPLER 60 DA VINCI SURE FORM (STAPLE) ×3
STAPLER 60 SUREFORM DVNC (STAPLE) ×2 IMPLANT
STAPLER CANNULA SEAL DVNC XI (STAPLE) ×2 IMPLANT
STAPLER CANNULA SEAL XI (STAPLE) ×3
STAPLER RELOAD 3.5X60 BLU DVNC (STAPLE) ×12
STAPLER RELOAD 3.5X60 BLUE (STAPLE) ×18
STAPLER RELOAD 4.3X60 GREEN (STAPLE) ×3
STAPLER RELOAD 4.3X60 GRN DVNC (STAPLE) ×2
SUT ETHIBOND 0 36 GRN (SUTURE) IMPLANT
SUT MNCRL AB 4-0 PS2 18 (SUTURE) ×3 IMPLANT
SUT VICRYL 0 TIES 12 18 (SUTURE) ×3 IMPLANT
SUT VLOC 180 2-0 9IN GS21 (SUTURE) IMPLANT
SYR 20ML LL LF (SYRINGE) ×3 IMPLANT
TAPE STRIPS DRAPE STRL (GAUZE/BANDAGES/DRESSINGS) ×3 IMPLANT
TOWEL OR 17X26 10 PK STRL BLUE (TOWEL DISPOSABLE) ×3 IMPLANT
TOWEL OR NON WOVEN STRL DISP B (DISPOSABLE) ×3 IMPLANT
TRAY FOLEY MTR SLVR 16FR STAT (SET/KITS/TRAYS/PACK) IMPLANT
TROCAR ADV FIXATION 5X100MM (TROCAR) IMPLANT
TROCAR BLADELESS OPT 5 100 (ENDOMECHANICALS) ×3 IMPLANT
TUBING INSUFFLATION 10FT LAP (TUBING) ×3 IMPLANT

## 2020-02-27 NOTE — Progress Notes (Signed)
Patient requested to take home medication and RN stated that this was allowed and would have to be verified by pharmacy before dispensing back to the patient. Patient stated he will take medication from here.

## 2020-02-27 NOTE — Anesthesia Procedure Notes (Addendum)
Procedure Name: Intubation Date/Time: 02/27/2020 7:30 AM Performed by: Lissa Morales, CRNA Pre-anesthesia Checklist: Patient identified, Emergency Drugs available, Suction available and Patient being monitored Patient Re-evaluated:Patient Re-evaluated prior to induction Oxygen Delivery Method: Circle system utilized Preoxygenation: Pre-oxygenation with 100% oxygen Induction Type: IV induction Laryngoscope Size: Glidescope and 4 Grade View: Grade III Tube type: Oral Tube size: 8.0 mm Number of attempts: 1 Airway Equipment and Method: Stylet and Oral airway Placement Confirmation: ETT inserted through vocal cords under direct vision,  positive ETCO2 and breath sounds checked- equal and bilateral Secured at: 24 cm Tube secured with: Tape Dental Injury: Teeth and Oropharynx as per pre-operative assessment  Difficulty Due To: Difficulty was unanticipated, Difficult Airway- due to large tongue, Difficult Airway- due to reduced neck mobility and Difficult Airway- due to limited oral opening

## 2020-02-27 NOTE — Op Note (Signed)
Operative Note  George Patton  [id number]  [id number]  02/27/2020   Surgeon: Romana Juniper MD   Assistant: Greer Pickerel MD   Procedure performed: robotic sleeve gastrectomy, upper endoscopy   Preop diagnosis: Morbid obesity Body mass index is 45.85 kg/m. Post-op diagnosis/intraop findings: same   Specimens: fundus Retained items: none  EBL: minimal  Complications: none   Description of procedure: After obtaining informed consent and administration of chemical DVT prophylaxis in holding, the patient was taken to the operating room and placed supine on operating room table where general endotracheal anesthesia was initiated, preoperative antibiotics were administered, SCDs applied, and a formal timeout was performed. The abdomen was prepped and draped in usual sterile fashion. Peritoneal access was initially attempted with the left upper quadrant Veress but ultimately gained using a Visiport technique in the left supraumbilical region and insufflation to 15 mmHg ensued without issue. Gross inspection revealed no evidence of injury. Under direct visualization a right paramedian 15 mm trocar and a left paramedian 8 mm trocar were placed as well as a left upper quadrant 5 mm trocar for the assistant.  Bilateral laparoscopic assisted TAPS blocks were performed with Exparel diluted with 0.25 percent Marcaine with epinephrine. The patient was placed in steep Trendelenburg and the liver retractor was introduced through an incision in the upper midline and secured to the post externally to maintain the left lobe retracted anteriorly.  The hiatus was inspected and there was no hiatal hernia. At this point the robot was docked and all instruments inserted under direct visualization. Using the vessel sealer, the greater curvature of the stomach was dissected away from the greater omentum and short gastric vessels were divided. This began 6 cm from the pylorus, and dissection proceeded until the left crus was  clearly exposed. There were some filmy adhesions of the posterior stomach to the pancreas which were divided with the Harmonic. Esophageal fat pad was mobilized off the anterior stomach slightly. The 74 [place] VisiGi was then introduced and directed down towards the pylorus. This was placed to suction against the lesser curve. Serial fires of the robotic 60 mm sureform stapler were then employed to create our sleeve. The first fire used a green load and ensured adequate room at the angularis incisura.  The remaining fires used blue loads to create a narrow tubular stomach up to the angle of His. The excised stomach was then removed through our 15 mm trocar site.  The visigi was taken off of suction and a few puffs of air were introduced, inflating the sleeve. No bubbles were observed in the irrigation fluid around the stomach and the shape was noted to be evenly tubular without any narrowing at the angularis. The visigi was then removed. Upper endoscopy was performed while the abdomen was instilled with saline to perform a leak test. The sleeve was noted to be airtight, the staple line was hemostatic.  There is no angulation or undue narrowing specifically at the incisura.  The endoscope was removed. The 15 mm trocar site fascia in the right upper abdomen was closed with 2 interrupted sutures of 0 Vicryl using the laparoscopic suture passer under direct visualization. The liver retractor was removed under direct visualization. The abdomen was then desufflated and all remaining trochars removed. The skin incisions were closed with subcuticular Monocryl; benzoin, Steri-Strips and Band-Aids were applied The patient was then awakened, extubated and taken to PACU in stable condition.     All counts were correct at the completion of the  case.

## 2020-02-27 NOTE — Progress Notes (Signed)
PHARMACY CONSULT FOR:  Risk Assessment for Post-Discharge VTE Following Bariatric Surgery  Post-Discharge VTE Risk Assessment: This patient's probability of 30-day post-discharge VTE is increased due to the factors marked: X  Male    Age >/=60 years    BMI >/=50 kg/m2    CHF    Dyspnea at Rest    Paraplegia  X  Non-gastric-band surgery    Operation Time >/=3 hr    Return to OR     Length of Stay >/= 3 d   Hx of VTE   Hypercoagulable condition   Significant venous stasis       Predicted probability of 30-day post-discharge VTE: 0.31%  Other patient-specific factors to consider: none  Recommendation for Discharge: No pharmacologic prophylaxis post-discharge  George Patton is a 50 y.o. male who underwent laparoscopic sleeve gastrectomy 02/27/2020   Allergies  Allergen Reactions  . Shellfish Allergy Anaphylaxis    Patient Measurements: Height: 6\' 3"  (190.5 cm) Weight: (!) 166.4 kg (366 lb 12.8 oz) IBW/kg (Calculated) : 84.5 Body mass index is 45.85 kg/m.  No results for input(s): WBC, HGB, HCT, PLT, APTT, CREATININE, LABCREA, CREATININE, CREAT24HRUR, MG, PHOS, ALBUMIN, PROT, ALBUMIN, AST, ALT, ALKPHOS, BILITOT, BILIDIR, IBILI in the last 72 hours. Estimated Creatinine Clearance: 101.1 mL/min (A) (by C-G formula based on SCr of 1.45 mg/dL (H)).    Past Medical History:  Diagnosis Date  . Allergy   . Anxiety   . Arthritis    hips knees  . Asthma   . Depression   . Diabetes mellitus without complication ([place])   . Hypertension   . OSA on CPAP    No longer needs CPAP machine had a repeat sleep study     Medications Prior to Admission  Medication Sig Dispense Refill Last Dose  . amLODipine (NORVASC) 10 MG tablet Take 10 mg by mouth every evening.   02/26/2020 at Unknown time  . buPROPion (WELLBUTRIN XL) 150 MG 24 hr tablet Take 150 mg by mouth daily.   02/27/2020 at 0445  . carvedilol (COREG) 25 MG tablet Take 25 mg by mouth 2 (two) times daily.   02/27/2020 at  0445  . cloNIDine (CATAPRES) 0.3 MG tablet Take 0.3 mg by mouth every evening.   02/26/2020 at Unknown time  . fluticasone (FLONASE) 50 MCG/ACT nasal spray Place 1 spray into both nostrils daily.   Past Week at Unknown time  . levocetirizine (XYZAL) 5 MG tablet Take 5 mg by mouth every evening.   02/26/2020 at Unknown time  . lisinopril-hydrochlorothiazide (PRINZIDE,ZESTORETIC) 20-12.5 MG tablet TAKE TWO TABLETS BY MOUTH  DAILY (Patient taking differently: Take 2 tablets by mouth daily.) 60 tablet 11 02/26/2020 at Unknown time  . metFORMIN (GLUCOPHAGE) 500 MG tablet TAKE 1 TABLET BY MOUTH AT BEDTIME THEN  INCREASE  TO  TWICE  DAILY  AS  DIRECTED (Patient taking differently: Take 1,000 mg by mouth 2 (two) times daily.) 60 tablet 0 02/26/2020 at Unknown time  . Probiotic CAPS Take 1 capsule by mouth every evening.   02/26/2020 at Unknown time  . sildenafil (VIAGRA) 100 MG tablet Take 100 mg by mouth as needed for erectile dysfunction.   Past Week at Unknown time  . spironolactone (ALDACTONE) 25 MG tablet Take 1 tablet (25 mg total) by mouth daily. (Patient taking differently: Take 25 mg by mouth every evening.) 90 tablet 3 02/26/2020 at Unknown time  . Vitamin D, Ergocalciferol, (DRISDOL) 1.25 MG (50000 UNIT) CAPS capsule Take 50,000 Units by mouth  every Monday.   02/20/2020  . albuterol (PROVENTIL HFA;VENTOLIN HFA) 108 (90 BASE) MCG/ACT inhaler Inhale 2 puffs into the lungs every 6 (six) hours as needed for wheezing or shortness of breath. 1 Inhaler 2 More than a month at Unknown time  . EPINEPHrine (EPIPEN 2-PAK) 0.3 mg/0.3 mL IJ SOAJ injection Inject 0.3 mLs (0.3 mg total) into the muscle once. Repeat if needed 2 Device 1 never  . montelukast (SINGULAIR) 10 MG tablet Take 10 mg by mouth every evening.   More than a month at Unknown time     Eudelia Bunch, Pharm.D 02/27/2020 12:26 PM

## 2020-02-27 NOTE — Anesthesia Postprocedure Evaluation (Signed)
Anesthesia Post Note  Patient: George Patton  Procedure(s) Performed: XI ROBOTIC GASTRIC SLEEVE RESECTION (N/A Abdomen) UPPER GI ENDOSCOPY (N/A )     Patient location during evaluation: PACU Anesthesia Type: General Level of consciousness: awake and alert Pain management: pain level controlled Vital Signs Assessment: post-procedure vital signs reviewed and stable Respiratory status: spontaneous breathing, nonlabored ventilation, respiratory function stable and patient connected to nasal cannula oxygen Cardiovascular status: blood pressure returned to baseline and stable Postop Assessment: no apparent nausea or vomiting Anesthetic complications: no   No complications documented.  Last Vitals:  Vitals:   02/27/20 1537 02/27/20 1633  BP: (!) 155/110 (!) 154/98  Pulse: 91 91  Resp: 20 17  Temp: 36.5 C 36.8 C  SpO2: 98% 100%    Last Pain:  Vitals:   02/27/20 1537  TempSrc: Oral  PainSc:                  March Rummage Caniya Tagle

## 2020-02-27 NOTE — H&P (Signed)
Surgical Evaluation  Chief Complaint: morbid obesity  HPI: 50yo man returns for follow up regarding surgical management of morbid obesity. He denies any changes in his health since our initial meeting in March of this year. He did meet with Dr. Debara Pickett and has had his HT meds adjusted somewhat. He has completed the bariatric pathway with no barriers identified and is ready to proceed with sleeve gastrectomy.     Labs (10/20/19): H pylori negative, HgA1C 7.2, otherwise unremarkable (CBC, CMP, folic acid, iron, TSH, B12, lipid panel, Vit D) CXR/UGI 04/01/19- negative, no hiatal hernia Dietician Lexine Baton Short 04/21/19)- approved Psychology Clarice Pole 07/05/19 and 11/17/19)- approved  Initial Visit 03/18/19: This is a very pleasant 50 year old man who presents today to discuss surgical management of severe obesity. He has been struggling with this for at least the last 10 years, but more so in the last few years. He's been considering surgery for about a year. He has tried numerous methods of weight loss without success. He reports his current diet is well balanced. His wife is gluten-free and his daughter is a vegetarian, so that affects his intake. He does admit that the carbohydrate volume in his diet might be higher than it should be. He is fairly active through his job. He is interested in sleeve gastrectomy.  Medical history: hypertension, diabetes, asthma, anxiety disorder, reflux, sleep apnea. PCP is Dr. Horald Pollen. Surgical history: inguinal hernia repair, vasectomy Family history: asthma, hypertension, cancer, migraines, stroke, CAD/ MI Social history: he is a Engineer, maintenance for [employer], lots of walking and moving with this job. He lives with his wife, their son and daughter. NO drug or tobacco use, rare etOH.  393lb/ BMI 47.8       Allergies  Allergen Reactions  . Shellfish Allergy Anaphylaxis        Past Medical History:  Diagnosis Date  . Allergy   .  Asthma   . Diabetes mellitus without complication ([place])   . Hypertension   . OSA on CPAP    No longer needs CPAP machine had a repeat sleep study         Past Surgical History:  Procedure Laterality Date  . COLONOSCOPY WITH PROPOFOL N/A 09/09/2019   Procedure: COLONOSCOPY WITH PROPOFOL;  Surgeon: Carol Ada, MD;  Location: [facility];  Service: Endoscopy;  Laterality: N/A;  . HEMOSTASIS CLIP PLACEMENT  09/09/2019   Procedure: HEMOSTASIS CLIP PLACEMENT;  Surgeon: Carol Ada, MD;  Location: [facility];  Service: Endoscopy;;  . HERNIA REPAIR    . POLYPECTOMY  09/09/2019   Procedure: POLYPECTOMY;  Surgeon: Carol Ada, MD;  Location: [facility];  Service: Endoscopy;;  . VASECTOMY           Family History  Problem Relation Age of Onset  . Asthma Mother   . Mitral valve prolapse Mother   . Hypertension Mother   . Migraines Sister   . Asthma Son   . Cancer Father   . Stroke Maternal Grandmother   . Heart attack Maternal Grandfather     Social History        Socioeconomic History  . Marital status: Married    Spouse name: Not on file  . Number of children: 3  . Years of education: BS  . Highest education level: Not on file  Occupational History  . Not on file  Tobacco Use  . Smoking status: Former Smoker    Packs/day: 0.25    Types: Cigarettes    Quit date:  08/06/2013    Years since quitting: 6.3  . Smokeless tobacco: Never Used  Substance and Sexual Activity  . Alcohol use: Yes    Alcohol/week: 1.0 standard drink    Types: 1 Shots of liquor per week    Comment: Once a month   . Drug use: No  . Sexual activity: Yes  Other Topics Concern  . Not on file  Social History Narrative   Consumes about 2 cups of coffee a day    Social Determinants of Health   Financial Resource Strain: Not on file  Food Insecurity: Not on file  Transportation Needs: Not on file  Physical Activity: Not on file  Stress: Not on  file  Social Connections: Not on file          Current Outpatient Medications on File Prior to Visit  Medication Sig Dispense Refill  . albuterol (PROVENTIL HFA;VENTOLIN HFA) 108 (90 BASE) MCG/ACT inhaler Inhale 2 puffs into the lungs every 6 (six) hours as needed for wheezing or shortness of breath. 1 Inhaler 2  . azelastine (OPTIVAR) 0.05 % ophthalmic solution Place 1 drop into both eyes daily.    Marland Kitchen buPROPion (WELLBUTRIN XL) 150 MG 24 hr tablet Take 150 mg by mouth daily.    . carvedilol (COREG) 25 MG tablet Take 25 mg by mouth 2 (two) times daily.    . Cholecalciferol (VITAMIN D3 PO) Take 1 capsule by mouth daily.    . cloNIDine (CATAPRES) 0.3 MG tablet Take 0.3 mg by mouth daily.    Marland Kitchen EPINEPHrine (EPIPEN 2-PAK) 0.3 mg/0.3 mL IJ SOAJ injection Inject 0.3 mLs (0.3 mg total) into the muscle once. Repeat if needed 2 Device 1  . fluticasone (FLONASE) 50 MCG/ACT nasal spray Place 1 spray into both nostrils daily.    Marland Kitchen levocetirizine (XYZAL) 5 MG tablet Take 5 mg by mouth daily.    Marland Kitchen lisinopril-hydrochlorothiazide (PRINZIDE,ZESTORETIC) 20-12.5 MG tablet TAKE TWO TABLETS BY MOUTH  DAILY (Patient taking differently: Take 2 tablets by mouth daily. ) 60 tablet 11  . metFORMIN (GLUCOPHAGE) 500 MG tablet TAKE 1 TABLET BY MOUTH AT BEDTIME THEN  INCREASE  TO  TWICE  DAILY  AS  DIRECTED (Patient taking differently: Take 1,000 mg by mouth 2 (two) times daily. ) 60 tablet 0  . montelukast (SINGULAIR) 10 MG tablet Take 10 mg by mouth daily.    . Probiotic CAPS Take 1 capsule by mouth daily.    . sildenafil (VIAGRA) 100 MG tablet Take 100 mg by mouth as needed for erectile dysfunction.    Marland Kitchen spironolactone (ALDACTONE) 25 MG tablet Take 1 tablet (25 mg total) by mouth daily. 90 tablet 3  . Telmisartan-amLODIPine 80-10 MG TABS Take by mouth.    . Vitamin D, Ergocalciferol, (DRISDOL) 1.25 MG (50000 UNIT) CAPS capsule Take 50,000 Units by mouth once a week.     No current  facility-administered medications on file prior to visit.    Review of Systems: a complete, 10pt review of systems was completed with pertinent positives and negatives as documented in the HPI  Physical Exam: N/a- phone visit   CBC Latest Ref Rng & Units 09/09/2019 06/20/2015 04/24/2014  WBC 4.0 - 10.5 K/uL - 8.8 7.7  Hemoglobin 13.0 - 17.0 g/dL 16.0 14.6 14.9  Hematocrit 39.0 - 52.0 % 47.0 44.7 45.0  Platelets 150.0 - 400.0 K/uL - 255.0 252    CMP Latest Ref Rng & Units 12/14/2019 09/09/2019 07/16/2016  Glucose 65 - 99 mg/dL 153(H) 154(H) 114(H)  BUN 6 - 24 mg/dL 15 11 17   Creatinine 0.76 - 1.27 mg/dL 1.41(H) 1.30(H) 1.31  Sodium 134 - 144 mmol/L 140 142 139  Potassium 3.5 - 5.2 mmol/L 3.8 3.1(L) 3.5  Chloride 96 - 106 mmol/L 101 98 104  CO2 20 - 29 mmol/L 21 - 28  Calcium 8.7 - 10.2 mg/dL 9.4 - 9.4  Total Protein 6.0 - 8.5 g/dL 6.9 - 7.2  Total Bilirubin 0.0 - 1.2 mg/dL 0.3 - 0.6  Alkaline Phos 44 - 121 IU/L 97 - 65  AST 0 - 40 IU/L 17 - 25  ALT 0 - 44 IU/L 29 - 36    Recent Labs  No results found for: INR, PROTIME    Imaging: Imaging Results (Last 48 hours)  No results found.     A/P:  MORBID OBESITY (E66.01) Story: He is a good candidate for sleeve gastrectomy. We discussed that this will have a good effect on his diabetes although not as powerful as a gastric bypass. We discussed the surgery including technical aspects, the risks of bleeding, infection, pain, scarring, injury to intra-abdominal structures, staple line leak or abscess, chronic abdominal pain or nausea, new onset or worsened GERD, DVT/PE, pneumonia, heart attack, stroke, death, failure to reach weight loss goals and weight regain, hernia. Discussed the typical peri-, and postoperative course. Discussed the importance of lifelong behavioral changes to combat the chronic and relapsing disease which is obesity. Questions were welcomed and answered. Plan to proceed as scheduled on 01/17/20. DIABETES  (E11.9) Story: Medications include metformin HYPERTENSION (I10) Story: Multiple medications, managed by Dr. Debara Pickett. ANXIETY DISORDER (F41.9) Story: Takes bupropion SLEEP APNEA (G47.30) GERD (GASTROESOPHAGEAL REFLUX DISEASE) (K21.9) Story: Situational, no medications ASTHMA (J45.909)       Patient Active Problem List   Diagnosis Date Noted  . Non-insulin dependent type 2 diabetes mellitus ([place]) 05/16/2013  . OSA (obstructive sleep apnea) 01/24/2013  . Obesity 01/24/2013  . Hypertension 02/12/2011       Romana Juniper, MD [facility], [place]

## 2020-02-27 NOTE — Progress Notes (Signed)
Discussed post op day goals with patient including ambulation, IS, diet progression, pain, and nausea control.  BSTOP education provided including BSTOP information guide, "Guide for Pain Management after your Bariatric Procedure".  Questions answered. 

## 2020-02-27 NOTE — Transfer of Care (Signed)
Immediate Anesthesia Transfer of Care Note  Patient: George Patton  Procedure(s) Performed: XI ROBOTIC GASTRIC SLEEVE RESECTION (N/A Abdomen) UPPER GI ENDOSCOPY (N/A )  Patient Location: PACU  Anesthesia Type:General  Level of Consciousness: awake, alert , oriented and patient cooperative  Airway & Oxygen Therapy: Patient Spontanous Breathing and Patient connected to face mask oxygen  Post-op Assessment: Report given to RN, Post -op Vital signs reviewed and stable and Patient moving all extremities X 4  Post vital signs: stable  Last Vitals:  Vitals Value Taken Time  BP 153/92 02/27/20 1000  Temp 36.8 C 02/27/20 1000  Pulse 75 02/27/20 1004  Resp 15 02/27/20 1004  SpO2 98 % 02/27/20 1004  Vitals shown include unvalidated device data.  Last Pain:  Vitals:   02/27/20 1000  TempSrc:   PainSc: 0-No pain         Complications: No complications documented.

## 2020-02-28 ENCOUNTER — Encounter (HOSPITAL_COMMUNITY): Payer: Self-pay | Admitting: Surgery

## 2020-02-28 ENCOUNTER — Other Ambulatory Visit (HOSPITAL_COMMUNITY): Payer: Self-pay | Admitting: Surgery

## 2020-02-28 LAB — BASIC METABOLIC PANEL
Anion gap: 11 (ref 5–15)
BUN: 31 mg/dL — ABNORMAL HIGH (ref 6–20)
CO2: 20 mmol/L — ABNORMAL LOW (ref 22–32)
Calcium: 7.7 mg/dL — ABNORMAL LOW (ref 8.9–10.3)
Chloride: 107 mmol/L (ref 98–111)
Creatinine, Ser: 1.85 mg/dL — ABNORMAL HIGH (ref 0.61–1.24)
GFR, Estimated: 44 mL/min — ABNORMAL LOW (ref >60)
Glucose, Bld: 148 mg/dL — ABNORMAL HIGH (ref 70–99)
Potassium: 5.5 mmol/L — ABNORMAL HIGH (ref 3.5–5.1)
Sodium: 138 mmol/L (ref 135–145)

## 2020-02-28 LAB — COMPREHENSIVE METABOLIC PANEL
ALT: 56 U/L — ABNORMAL HIGH (ref 0–44)
AST: 48 U/L — ABNORMAL HIGH (ref 15–41)
Albumin: 4 g/dL (ref 3.5–5.0)
Alkaline Phosphatase: 53 U/L (ref 38–126)
Anion gap: 10 (ref 5–15)
BUN: 29 mg/dL — ABNORMAL HIGH (ref 6–20)
CO2: 22 mmol/L (ref 22–32)
Calcium: 8.8 mg/dL — ABNORMAL LOW (ref 8.9–10.3)
Chloride: 106 mmol/L (ref 98–111)
Creatinine, Ser: 1.98 mg/dL — ABNORMAL HIGH (ref 0.61–1.24)
GFR, Estimated: 40 mL/min — ABNORMAL LOW (ref >60)
Glucose, Bld: 127 mg/dL — ABNORMAL HIGH (ref 70–99)
Potassium: 4.5 mmol/L (ref 3.5–5.1)
Sodium: 138 mmol/L (ref 135–145)
Total Bilirubin: 0.9 mg/dL (ref 0.3–1.2)
Total Protein: 6.9 g/dL (ref 6.5–8.1)

## 2020-02-28 LAB — CBC WITH DIFFERENTIAL/PLATELET
Abs Immature Granulocytes: 0.06 10*3/uL (ref 0.00–0.07)
Basophils Absolute: 0 10*3/uL (ref 0.0–0.1)
Basophils Relative: 0 %
Eosinophils Absolute: 0 10*3/uL (ref 0.0–0.5)
Eosinophils Relative: 0 %
HCT: 32.5 % — ABNORMAL LOW (ref 39.0–52.0)
Hemoglobin: 10.1 g/dL — ABNORMAL LOW (ref 13.0–17.0)
Immature Granulocytes: 0 %
Lymphocytes Relative: 8 %
Lymphs Abs: 1.1 10*3/uL (ref 0.7–4.0)
MCH: 26.4 pg (ref 26.0–34.0)
MCHC: 31.1 g/dL (ref 30.0–36.0)
MCV: 85.1 fL (ref 80.0–100.0)
Monocytes Absolute: 1.3 10*3/uL — ABNORMAL HIGH (ref 0.1–1.0)
Monocytes Relative: 9 %
Neutro Abs: 11.5 10*3/uL — ABNORMAL HIGH (ref 1.7–7.7)
Neutrophils Relative %: 83 %
Platelets: 261 10*3/uL (ref 150–400)
RBC: 3.82 MIL/uL — ABNORMAL LOW (ref 4.22–5.81)
RDW: 15.8 % — ABNORMAL HIGH (ref 11.5–15.5)
WBC: 14 10*3/uL — ABNORMAL HIGH (ref 4.0–10.5)
nRBC: 0 % (ref 0.0–0.2)

## 2020-02-28 LAB — GLUCOSE, CAPILLARY
Glucose-Capillary: 133 mg/dL — ABNORMAL HIGH (ref 70–99)
Glucose-Capillary: 120 mg/dL — ABNORMAL HIGH (ref 70–99)
Glucose-Capillary: 141 mg/dL — ABNORMAL HIGH (ref 70–99)

## 2020-02-28 LAB — CBC
HCT: 30.4 % — ABNORMAL LOW (ref 39.0–52.0)
Hemoglobin: 9.6 g/dL — ABNORMAL LOW (ref 13.0–17.0)
MCH: 27.6 pg (ref 26.0–34.0)
MCHC: 31.6 g/dL (ref 30.0–36.0)
MCV: 87.4 fL (ref 80.0–100.0)
Platelets: 232 10*3/uL (ref 150–400)
RBC: 3.48 MIL/uL — ABNORMAL LOW (ref 4.22–5.81)
RDW: 15.9 % — ABNORMAL HIGH (ref 11.5–15.5)
WBC: 13.4 10*3/uL — ABNORMAL HIGH (ref 4.0–10.5)
nRBC: 0 % (ref 0.0–0.2)

## 2020-02-28 LAB — MAGNESIUM: Magnesium: 2.1 mg/dL (ref 1.7–2.4)

## 2020-02-28 LAB — SURGICAL PATHOLOGY

## 2020-02-28 MED ORDER — METHOCARBAMOL 500 MG PO TABS: 500.0000 mg | ORAL_TABLET | Freq: Four times a day (QID) | ORAL | 0 refills | Status: DC | PRN

## 2020-02-28 MED ORDER — PANTOPRAZOLE SODIUM 40 MG PO TBEC: 40.0000 mg | DELAYED_RELEASE_TABLET | Freq: Every day | ORAL | 0 refills | Status: DC

## 2020-02-28 MED ORDER — METHOCARBAMOL 500 MG PO TABS: 500.0000 mg | ORAL_TABLET | Freq: Four times a day (QID) | ORAL | Status: DC | PRN

## 2020-02-28 MED ORDER — GABAPENTIN 100 MG PO CAPS: 200.0000 mg | ORAL_CAPSULE | Freq: Two times a day (BID) | ORAL | 0 refills | Status: DC

## 2020-02-28 MED ORDER — TRAMADOL HCL 50 MG PO TABS: 50.0000 mg | ORAL_TABLET | Freq: Four times a day (QID) | ORAL | 0 refills | Status: DC | PRN

## 2020-02-28 MED ORDER — DOCUSATE SODIUM 100 MG PO CAPS: 100.0000 mg | ORAL_CAPSULE | Freq: Two times a day (BID) | ORAL | 0 refills | Status: DC

## 2020-02-28 MED ORDER — ONDANSETRON 4 MG PO TBDP: 4.0000 mg | ORAL_TABLET | Freq: Four times a day (QID) | ORAL | 0 refills | Status: DC | PRN

## 2020-02-28 MED ORDER — SPIRONOLACTONE 25 MG PO TABS: 25.0000 mg | ORAL_TABLET | Freq: Every evening | ORAL | 0 refills | Status: DC

## 2020-02-28 MED ORDER — ACETAMINOPHEN 500 MG PO TABS: 1000.0000 mg | ORAL_TABLET | Freq: Three times a day (TID) | ORAL | 0 refills | Status: AC

## 2020-02-28 MED FILL — GABAPENTIN 100 MG CAPSULE: 100 | 5 days supply | Qty: 20 | Fill #0

## 2020-02-28 MED FILL — METHOCARBAMOL 500 MG TABS: 500 | 5 days supply | Qty: 20 | Fill #0

## 2020-02-28 MED FILL — PANTOPRAZOLE SOD DR 40 MG T: 40 | 30 days supply | Qty: 30 | Fill #0

## 2020-02-28 MED FILL — ONDANSETRON ODT 4 MG TABLET: 4 | 5 days supply | Qty: 20 | Fill #0

## 2020-02-28 MED FILL — traMADol HCL 50 MG TABS: 50 | 2 days supply | Qty: 10 | Fill #0

## 2020-02-28 NOTE — Progress Notes (Signed)
D/C instructions given to patient. Patient had no questions. NT or writer will wheel patient out once he is dressed  

## 2020-02-28 NOTE — Progress Notes (Signed)
S: No acute events. Fair amount of upper abdominal pain, some shoulder pain. No nausea, tolerating clears/ protein shakes. Walking in halls.   O: Vitals, labs, intake/output, and orders reviewed at this time.  T-max 98.4, heart rate 57-95, respirations 18, blood pressure 130/86, has been hypertensive much of the afternoon yesterday, sats 100% on room air.  P.o. intake 900, urine output has been recorded as 7 voids occurrences.  CMP notable only for Cr 1.98 (1.45 preop).  WBC 14 (8 preop), HGB 10.1 (14.3 preop)   Gen: A&Ox3, no distress  H&N: EOMI, atraumatic, neck supple Chest: unlabored respirations, RRR Abd: soft, minimally/ appropriately tender, nondistended, incision(s) c/d/i without cellulitis or hematoma Ext: warm, no edema Neuro: grossly normal  Lines/tubes/drains: PIV  A/P: POD1 robotic sleeve gastrectomy -Continue clear liquids and protein shake -Continue prophylactic Lovenox and SCDs, frequent ambulation -Continue pulmonary toilet -AKI and ABLA: continue IVF, hold lovenox. No clinical sign of ongoing bleeding. Repeat labs this afternoon -Plan discharge later today if continuing to do well/hemoglobin stable.    Romana Juniper, MD [facility] Surgery, [place]

## 2020-02-28 NOTE — Progress Notes (Signed)
Nutrition Education Note ° °Received consult for diet education for patient s/p bariatric surgery. ° °Discussed 2 week post op diet with pt. Emphasized that liquids must be non carbonated, non caffeinated, and sugar free. Fluid goals discussed. Pt to follow up with outpatient bariatric RD for further diet progression after 2 weeks. Multivitamins and minerals also reviewed. Teach back method used, pt expressed understanding, expect good compliance. ° °If nutrition issues arise, please consult RD. ° °Lindsey Baker, MS, RD, LDN °Inpatient Clinical Dietitian °Contact information available via Amion ° ° °

## 2020-02-28 NOTE — Progress Notes (Signed)
Patient alert and oriented, Post op day 1.  Provided support and encouragement.  Encouraged pulmonary toilet, ambulation and small sips of liquids.  All questions answered.  Waiting for f/u labs this pm.  Will continue to monitor.  Plan to D/C later this afternoon. Marland Kitchen

## 2020-02-28 NOTE — Discharge Instructions (Signed)
GASTRIC BYPASS / SLEEVE  Home Care Instructions  These instructions are to help you care for yourself when you go home.  Call: If you have any problems. . Call [phone] and ask for the surgeon on call . If you have an emergency related to your surgery please use the ER at [facility].  . Tell the ER staff that you are a new post-op gastric bypass or gastric sleeve patient   Signs and symptoms to report: . Severe vomiting or nausea o If you cannot handle clear liquids for longer than 1 day, call your surgeon  . Abdominal pain which does not get better after taking your pain medication . Fever greater than 100.4 F and chills . Heart rate over 100 beats a minute . Trouble breathing . Chest pain .  Redness, swelling, drainage, or foul odor at incision (surgical) sites .  If your incisions open or pull apart . Swelling or pain in calf (lower leg) . Diarrhea (Loose bowel movements that happen often), frequent watery, uncontrolled bowel movements . Constipation, (no bowel movements for 3 days) if this happens:  o Take Milk of Magnesia, 2 tablespoons by mouth, 3 times a day for 2 days if needed o Stop taking Milk of Magnesia once you have had a bowel movement o Call your doctor if constipation continues Or o Take Miralax  (instead of Milk of Magnesia) following the label instructions o Stop taking Miralax once you have had a bowel movement o Call your doctor if constipation continues . Anything you think is "abnormal for you"   Normal side effects after surgery: . Unable to sleep at night or unable to concentrate . Irritability . Being tearful (crying) or depressed These are common complaints, possibly related to your anesthesia, stress of surgery and change in lifestyle, that usually go away a few weeks after surgery.  If these feelings continue, call your medical doctor.  Wound Care: You may have surgical glue, steri-strips, or staples over your incisions after surgery . Surgical  glue:  Looks like a clear film over your incisions and will wear off a little at a time . Steri-strips : Adhesive strips of tape over your incisions. You may notice a yellowish color on the skin under the steri-strips. This is used to make the   steri-strips stick better. Do not pull the steri-strips off - let them fall off . Staples: Staples may be removed before you leave the hospital o If you go home with staples, call [facility] at for an appointment with your surgeon's nurse to have staples removed 10 days after surgery, [phone] . Showering: You may shower two (2) days after your surgery unless your surgeon tells you differently o Wash gently around incisions with warm soapy water, rinse well, and gently pat dry  o If you have a drain (tube from your incision), you may need someone to hold this while you shower  o No tub baths until staples are removed and incisions are healed     Medications: . Medications should be liquid or crushed if larger than the size of a dime . Extended release pills (medication that releases a little bit at a time through the day) should not be crushed . Depending on the size and number of medications you take, you may need to space (take a few throughout the day)/change the time you take your medications so that you do not over-fill your pouch (smaller stomach) . Make sure you follow-up with   your primary care physician to make medication changes needed during rapid weight loss and life-style changes . If you have diabetes, follow up with the doctor that orders your diabetes medication(s) within one week after surgery and check your blood sugar regularly. . Do not drive while taking narcotics (pain medications) . DO NOT take NSAID'S (Examples of NSAID's include ibuprofen, naproxen)  Diet:                    First 2 Weeks  You will see the nutritionist about two (2) weeks after your surgery. The nutritionist will increase the types of foods you can  eat if you are handling liquids well: . If you have severe vomiting or nausea and cannot handle clear liquids lasting longer than 1 day, call your surgeon  Protein Shake . Drink at least 2 ounces of shake 5-6 times per day . Each serving of protein shakes (usually 8 - 12 ounces) should have a minimum of:  o 15 grams of protein  o And no more than 5 grams of carbohydrate  . Goal for protein each day: o Men = 80 grams per day o Women = 60 grams per day . Protein powder may be added to fluids such as non-fat milk or Lactaid milk or Soy milk (limit to 35 grams added protein powder per serving)  Hydration . Slowly increase the amount of water and other clear liquids as tolerated (See Acceptable Fluids) . Slowly increase the amount of protein shake as tolerated  .  Sip fluids slowly and throughout the day . May use sugar substitutes in small amounts (no more than 6 - 8 packets per day; i.e. Splenda)  Fluid Goal . The first goal is to drink at least 8 ounces of protein shake/drink per day (or as directed by the nutritionist);  See handout from pre-op Bariatric Education Class for examples of protein shake/drink.   o Slowly increase the amount of protein shake you drink as tolerated o You may find it easier to slowly sip shakes throughout the day o It is important to get your proteins in first . Your fluid goal is to drink 64 - 100 ounces of fluid daily o It may take a few weeks to build up to this . 32 oz (or more) should be clear liquids  And  . 32 oz (or more) should be full liquids (see below for examples) . Liquids should not contain sugar, caffeine, or carbonation  Clear Liquids: . Water or Sugar-free flavored water (i.e. Fruit H2O, Propel) . Decaffeinated coffee or tea (sugar-free) . Ignatz Deis Lite, Wyler's Lite, Minute Maid Lite . Sugar-free Jell-O . Bouillon or broth . Sugar-free Popsicle:   *Less than 20 calories each; Limit 1 per day  Full Liquids: Protein Shakes/Drinks + 2  choices per day of other full liquids . Full liquids must be: o No More Than 12 grams of Carbs per serving  o No More Than 3 grams of Fat per serving . Strained low-fat cream soup . Non-Fat milk . Fat-free Lactaid Milk . Sugar-free yogurt (Dannon Lite & Fit, Greek yogurt)      Vitamins and Minerals . Start 1 day after surgery unless otherwise directed by your surgeon . Bariatric Specific Complete Multivitamins . Chewable Calcium Citrate with Vitamin D-3 (Example: 3 Chewable Calcium Plus 600 with Vitamin D-3) o Take 500 mg three (3) times a day for a total of 1500 mg each day o Do not take all 3 doses   of calcium at one time as it may cause constipation, and you can only absorb 500 mg  at a time  o Do not mix multivitamins containing iron with calcium supplements; take 2 hours apart  . Menstruating women and those at risk for anemia (a blood disease that causes weakness) may need extra iron o Talk with your doctor to see if you need more iron . If you need extra iron: Total daily Iron recommendation (including Vitamins) is 50 to 100 mg Iron/day . Do not stop taking or change any vitamins or minerals until you talk to your nutritionist or surgeon . Your nutritionist and/or surgeon must approve all vitamin and mineral supplements   Activity and Exercise: It is important to continue walking at home.  Limit your physical activity as instructed by your doctor.  During this time, use these guidelines: . Do not lift anything greater than ten (10) pounds for at least two (2) weeks . Do not go back to work or drive until your surgeon says you can . You may have sex when you feel comfortable  o It is VERY important for male patients to use a reliable birth control method; fertility often increases after surgery  o Do not get pregnant for at least 18 months . Start exercising as soon as your doctor tells you that you can o Make sure your doctor approves any physical activity . Start with a  simple walking program . Walk 5-15 minutes each day, 7 days per week.  . Slowly increase until you are walking 30-45 minutes per day Consider joining our BELT program. [phone] or email [email or web address]   Special Instructions Things to remember:  . Use your CPAP when sleeping if this applies to you, do not stop the use of CPAP unless directed by physician after a sleep study . [facility] has a free Bariatric Surgery Support Group that meets monthly, the 3rd Thursday, 6 pm.  Please review discharge information for date and location of this meeting. . It is very important to keep all follow up appointments with your surgeon, nutritionist, primary care physician, and behavioral health practitioner o After the first year, please follow up with your bariatric surgeon and nutritionist at least once a year in order to maintain best weight loss results   [facility]: [phone] [facility]: [phone] Bariatric Nurse Coordinator: [phone]      

## 2020-02-28 NOTE — Progress Notes (Addendum)
Patient alert and oriented, pain is controlled. Patient is tolerating fluids, advanced to protein shake today, patient is tolerating well. Reviewed Gastric Bypass discharge instructions with patient and patient is able to articulate understanding. Provided information on BELT program, Support Group and [facility] outpatient pharmacy. All questions answered, will continue to monitor.  Total 24hr fluid recall: 1122mL.  Per dehydration protocol, will call pt to f/u within one week post op.

## 2020-02-29 NOTE — Discharge Summary (Signed)
Physician Discharge Summary  George Patton WIO:[id number] DOB: 1970-01-14 DOA: 02/27/2020  PCP: [name], MD  Admit date: 02/27/2020 Discharge date: 02/28/2020  Recommendations for Outpatient Follow-up:    Follow-up Information    Clovis Riley, MD. Go on 03/22/2020.   Specialty: General Surgery Why: at 9:20am.  Please arrive 15 minutes prior to your appointment time.  Thank you. Contact information: [address] [place] [address] [phone]        [facility] Follow up on 04/18/2020.   Specialty: General Surgery Why: at 10:20 with Dr. Kae Heller.  Please arrive 15 minutes prior to your appointment time.  Thank you. Contact information: [address] [place] [address] [phone]        [name], MD. Schedule an appointment as soon as possible for a visit in 1 week(s).   Specialty: Family Medicine Why: Keep track of your blood pressure and blood glucose readings. Follow up with your PCP to titrate medications- blood pressure and blood sugars can change rapidly after bariatric surgery.  Contact information: [place] [address] [phone]              Discharge Diagnoses:  Active Problems:   Morbid obesity ([place])   Surgical Procedure: Laparoscopic Sleeve Gastrectomy, upper endoscopy  Discharge Condition: Good Disposition: Home  Diet recommendation: Postoperative sleeve gastrectomy diet (liquids only)  Filed Weights   02/27/20 0616  Weight: (!) 166.4 kg     Hospital Course:  The patient was admitted for a planned laparoscopic sleeve gastrectomy. Please see operative note. Preoperatively the patient was given 5000 units of subcutaneous heparin for DVT prophylaxis. Postoperative prophylactic Lovenox dosing was started on the evening of postoperative day 0. ERAS protocol was used. On the evening of postoperative day 0, the patient was started on water and ice  chips. On postoperative day 1 the patient had no fever or tachycardia and was tolerating water in their diet was gradually advanced throughout the day. The patient was ambulating without difficulty. Their vital signs are stable without fever or tachycardia. Their hemoglobin did drop but was rechecked that afternoon and had remained stable. He did have a slight increase in creatinine which was also rechecked and stable. The patient was maintained on their home settings for CPAP therapy. The patient had received discharge instructions and counseling. They were deemed stable for discharge and had met discharge criteria   Discharge Instructions   Allergies as of 02/28/2020      Reactions   Shellfish Allergy Anaphylaxis      Medication List    STOP taking these medications   lisinopril-hydrochlorothiazide 20-12.5 MG tablet Commonly known as: ZESTORETIC   metFORMIN 500 MG tablet Commonly known as: GLUCOPHAGE     TAKE these medications   acetaminophen 500 MG tablet Commonly known as: TYLENOL Take 2 tablets (1,000 mg total) by mouth every 8 (eight) hours for 5 days.   albuterol 108 (90 Base) MCG/ACT inhaler Commonly known as: VENTOLIN HFA Inhale 2 puffs into the lungs every 6 (six) hours as needed for wheezing or shortness of breath.   amLODipine 10 MG tablet Commonly known as: NORVASC Take 10 mg by mouth every evening. Notes to patient: Monitor Blood Pressure Daily and keep a log for primary care physician.  You may need to make changes to your medications with rapid weight loss.     buPROPion 150 MG 24 hr tablet Commonly known as:  WELLBUTRIN XL Take 150 mg by mouth daily.   carvedilol 25 MG tablet Commonly known as: COREG Take 25 mg by mouth 2 (two) times daily. Notes to patient: Monitor Blood Pressure Daily and keep a log for primary care physician.  You may need to make changes to your medications with rapid weight loss.     cloNIDine 0.3 MG tablet Commonly known as:  CATAPRES Take 0.3 mg by mouth every evening. Notes to patient: Monitor Blood Pressure Daily and keep a log for primary care physician.  You may need to make changes to your medications with rapid weight loss.     docusate sodium 100 MG capsule Commonly known as: COLACE Take 1 capsule (100 mg total) by mouth 2 (two) times daily. OK to decrease to once daily or discontinue if having diarrhea   EPINEPHrine 0.3 mg/0.3 mL Soaj injection Commonly known as: EpiPen 2-Pak Inject 0.3 mLs (0.3 mg total) into the muscle once. Repeat if needed   fluticasone 50 MCG/ACT nasal spray Commonly known as: FLONASE Place 1 spray into both nostrils daily.   gabapentin 100 MG capsule Commonly known as: NEURONTIN Take 2 capsules (200 mg total) by mouth every 12 (twelve) hours.   levocetirizine 5 MG tablet Commonly known as: XYZAL Take 5 mg by mouth every evening.   methocarbamol 500 MG tablet Commonly known as: ROBAXIN Take 1 tablet (500 mg total) by mouth every 6 (six) hours as needed for muscle spasms (pain).   montelukast 10 MG tablet Commonly known as: SINGULAIR Take 10 mg by mouth every evening.   ondansetron 4 MG disintegrating tablet Commonly known as: ZOFRAN-ODT Take 1 tablet (4 mg total) by mouth every 6 (six) hours as needed for nausea or vomiting.   pantoprazole 40 MG tablet Commonly known as: PROTONIX Take 1 tablet (40 mg total) by mouth daily.   Probiotic Caps Take 1 capsule by mouth every evening.   sildenafil 100 MG tablet Commonly known as: VIAGRA Take 100 mg by mouth as needed for erectile dysfunction.   spironolactone 25 MG tablet Commonly known as: ALDACTONE Take 1 tablet (25 mg total) by mouth every evening.   traMADol 50 MG tablet Commonly known as: ULTRAM Take 1 tablet (50 mg total) by mouth every 6 (six) hours as needed (pain).   Vitamin D (Ergocalciferol) 1.25 MG (50000 UNIT) Caps capsule Commonly known as: DRISDOL Take 50,000 Units by mouth every Monday.        Follow-up Information    Clovis Riley, MD. Go on 03/22/2020.   Specialty: General Surgery Why: at 9:20am.  Please arrive 15 minutes prior to your appointment time.  Thank you. Contact information: [address] [place] [address] [phone]        [facility] Follow up on 04/18/2020.   Specialty: General Surgery Why: at 10:20 with Dr. Kae Heller.  Please arrive 15 minutes prior to your appointment time.  Thank you. Contact information: [address] [place] [address] [phone]        [name], MD. Schedule an appointment as soon as possible for a visit in 1 week(s).   Specialty: Family Medicine Why: Keep track of your blood pressure and blood glucose readings. Follow up with your PCP to titrate medications- blood pressure and blood sugars can change rapidly after bariatric surgery.  Contact information: [address] [place] [address] [phone]  The results of significant diagnostics from this hospitalization (including imaging, microbiology, ancillary and laboratory) are listed below for reference.    Significant Diagnostic Studies: No results found.  Labs: Basic Metabolic Panel: Recent Labs  Lab 02/28/20 0504 02/28/20 1312  NA 138 138  K 4.5 5.5*  CL 106 107  CO2 22 20*  GLUCOSE 127* 148*  BUN 29* 31*  CREATININE 1.98* 1.85*  CALCIUM 8.8* 7.7*  MG 2.1  --    Liver Function Tests: Recent Labs  Lab 02/28/20 0504  AST 48*  ALT 56*  ALKPHOS 53  BILITOT 0.9  PROT 6.9  ALBUMIN 4.0    CBC: Recent Labs  Lab 02/28/20 0504 02/28/20 1312  WBC 14.0* 13.4*  NEUTROABS 11.5*  --   HGB 10.1* 9.6*  HCT 32.5* 30.4*  MCV 85.1 87.4  PLT 261 232    CBG: Recent Labs  Lab 02/27/20 1956 02/27/20 2338 02/28/20 0351 02/28/20 0741 02/28/20 1155  GLUCAP 168* 152* 133* 120* 141*    Active Problems:   Morbid obesity ([place])    Signed:  [facility] [place] [phone] 02/29/2020, 1:26 PM

## 2020-03-06 ENCOUNTER — Telehealth (HOSPITAL_COMMUNITY): Payer: Self-pay | Admitting: *Deleted

## 2020-03-06 NOTE — Telephone Encounter (Signed)
1.  Tell me about your pain and pain management? Pt denies any pain.   2.  Let's talk about fluid intake.  How much total fluid are you taking in? Pt states that he is getting in at least 64oz of fluid including protein shakes, bottled water, jello and broth.   3.  How much protein have you taken in the last 2 days? Pt states he is meeting his goal of 80g of protein with the protein shakes and unjury unflavored protein powder added to broths and strained soups.  4.  Have you had nausea?  Tell me about when have experienced nausea and what you did to help? Pt denies nausea.   5.  Has the frequency or color changed with your urine? Pt states that s/he is urinating "fine" with no changes in frequency or urgency.     6.  Tell me what your incisions look like? "Incisions look fine".  Pt stated that he still had band-aids on some.  Pt instructed to remove band aids and leave steri strips.  Pt states that he has a "very large" bruise to the left side of his abdomen.  He has already reached out to Dr. Kae Heller and his PCP. PCP did a CBC 03/05/20.  Pt states that Hgb is either 10.1 or 10.2, which is increased from D/C Hgb of 9.6.  Pt counseled to monitor for decreased Hgb (increased SOB, increased heart rate, pallor in gums, eyelids, palms of hands, or noticeable bleeding) Pt encouraged to call [facility] if symptoms worsen.   7.  Have you been passing gas? BM? Pt states that he is having BMs. Last BM 03/05/20.     8.  If a problem or question were to arise who would you call?  Do you know contact numbers for [facility], [facility], and [facility]? Pt can describe s/sx of dehydration.  Pt knows to call [facility] for surgical, [facility] for nutrition, and [facility] for non-urgent questions or concerns.   9.  How has the walking going? Pt states he is walking around and able to be active without difficulty.   10.  How are your vitamins and calcium going?  How are you taking them? Pt states that he is taking his supplements and vitamins fine, just  hard to remember at times. Pt encouraged to set timers on his phone or another device to serve as a reminder.  Will f/u with pt next week.

## 2020-03-13 ENCOUNTER — Other Ambulatory Visit: Payer: Self-pay

## 2020-03-13 ENCOUNTER — Telehealth (HOSPITAL_COMMUNITY): Payer: Self-pay | Admitting: *Deleted

## 2020-03-13 ENCOUNTER — Encounter: Payer: 59 | Attending: Surgery | Admitting: Skilled Nursing Facility1

## 2020-03-13 DIAGNOSIS — E119 Type 2 diabetes mellitus without complications: Secondary | ICD-10-CM | POA: Diagnosis present

## 2020-03-13 NOTE — Progress Notes (Signed)
Cardiology Office Note   Date:  03/14/2020   ID:  George Patton, DOB 1970/05/23, MRN [id number]  PCP:  [name], MD  Cardiologist:  Pixie Casino, MD EP: None  Chief Complaint  Patient presents with  . Follow-up    HTN      History of Present Illness: George Patton is a 50 y.o. male with a PMH of resistant hypertension, HLD, DM type 2, OSA on CPAP, and obesity, who presents for 3 month follow-up.  He was last evaluated by cardiology at an outpatient visit with Dr. Okey Regal 12/2019 for management of his HTN despite multiple medications. He had no anginal complaints at his last visit, though struggled with elevated blood pressures with SBP typically in the 170s. A renin aldosterone ratio was obtained and elevated suggesting primary hyperaldosteronism and he was recommended to start spironolactone 25mg  daily. A repeat BMET was recommended in 2 weeks, however not completed. Since he last visit he underwent gastric sleeve surgery 02/27/20. Cr elevated to 1.98 02/28/20, up from 1.41 12/2019. His lisinopril-HCTZ was held at that time.   He presents today for close follow-up of his HTN. He reports recovering well from gastric sleeve surgery. He has lost 20lbs already and reports feeling better. BP log reviewed. Prior to surgery he was getting occasional SBP readings in the 110s-120s, though predominately 130s. Following surgery and cessation of his lisinopril-HCTZ he has readings in the 130s-150s. We discussed possibly starting hydralazine to reach BP goal <130/80, however he would like to hold off and see how his symptoms improve with ongoing weight loss and exercise. He has no complaints of chest pain, SOB, DOE, palpitations, dizziness, lightheadedness, syncope, PND, or orthopnea. He reports compliance with his CPAP. He is hopeful to wean off medications in the coming months.    Past Medical History:  Diagnosis Date  . Allergy   . Anxiety   . Arthritis    hips knees  . Asthma   .  Depression   . Diabetes mellitus without complication ([place])   . Hypertension   . OSA on CPAP    No longer needs CPAP machine had a repeat sleep study    Past Surgical History:  Procedure Laterality Date  . COLONOSCOPY WITH PROPOFOL N/A 09/09/2019   Procedure: COLONOSCOPY WITH PROPOFOL;  Surgeon: Carol Ada, MD;  Location: [facility];  Service: Endoscopy;  Laterality: N/A;  . HEMOSTASIS CLIP PLACEMENT  09/09/2019   Procedure: HEMOSTASIS CLIP PLACEMENT;  Surgeon: Carol Ada, MD;  Location: [facility];  Service: Endoscopy;;  . HERNIA REPAIR    . POLYPECTOMY  09/09/2019   Procedure: POLYPECTOMY;  Surgeon: Carol Ada, MD;  Location: [facility];  Service: Endoscopy;;  . UPPER GI ENDOSCOPY N/A 02/27/2020   Procedure: UPPER GI ENDOSCOPY;  Surgeon: Clovis Riley, MD;  Location: [facility];  Service: General;  Laterality: N/A;  . VASECTOMY       Current Outpatient Medications  Medication Sig Dispense Refill  . albuterol (PROVENTIL HFA;VENTOLIN HFA) 108 (90 BASE) MCG/ACT inhaler Inhale 2 puffs into the lungs every 6 (six) hours as needed for wheezing or shortness of breath. 1 Inhaler 2  . amLODipine (NORVASC) 10 MG tablet Take 10 mg by mouth every evening.    Marland Kitchen buPROPion (WELLBUTRIN XL) 150 MG 24 hr tablet Take 150 mg by mouth daily.    . carvedilol (COREG) 25 MG tablet Take 25 mg by mouth 2 (two) times daily.    . cloNIDine (CATAPRES) 0.3  MG tablet Take 0.3 mg by mouth every evening.    Marland Kitchen EPINEPHrine (EPIPEN 2-PAK) 0.3 mg/0.3 mL IJ SOAJ injection Inject 0.3 mLs (0.3 mg total) into the muscle once. Repeat if needed 2 Device 1  . fluticasone (FLONASE) 50 MCG/ACT nasal spray Place 1 spray into both nostrils as needed.    Marland Kitchen levocetirizine (XYZAL) 5 MG tablet Take 5 mg by mouth every evening.    . ondansetron (ZOFRAN-ODT) 4 MG disintegrating tablet Take 1 tablet (4 mg total) by mouth every 6 (six) hours as needed for nausea or vomiting. 20 tablet 0  . pantoprazole (PROTONIX) 40 MG tablet  Take 1 tablet (40 mg total) by mouth daily. 90 tablet 0  . Probiotic CAPS Take 1 capsule by mouth every evening.    . sildenafil (VIAGRA) 100 MG tablet Take 100 mg by mouth as needed for erectile dysfunction.    Marland Kitchen spironolactone (ALDACTONE) 25 MG tablet Take 1 tablet (25 mg total) by mouth every evening. 1 tablet 0  . Vitamin D, Ergocalciferol, (DRISDOL) 1.25 MG (50000 UNIT) CAPS capsule Take 50,000 Units by mouth every Monday.     No current facility-administered medications for this visit.    Allergies:   Shellfish allergy    Social History:  The patient  reports that he quit smoking about 6 years ago. His smoking use included cigarettes. He smoked 0.25 packs per day. He has never used smokeless tobacco. He reports current alcohol use of about 1.0 standard drink of alcohol per week. He reports that he does not use drugs.   Family History:  The patient's family history includes Asthma in his mother and son; Cancer in his father; Heart attack in his maternal grandfather; Hypertension in his mother; Migraines in his sister; Mitral valve prolapse in his mother; Stroke in his maternal grandmother.    ROS:  Please see the history of present illness.   Otherwise, review of systems are positive for none.   All other systems are reviewed and negative.    PHYSICAL EXAM: VS:  BP (!) 150/90   Pulse 94   Ht 6\' 3"  (1.905 m)   Wt (!) 350 lb (158.8 kg)   SpO2 97%   BMI 43.75 kg/m  , BMI Body mass index is 43.75 kg/m. GEN: Well nourished, well developed, in no acute distress HEENT: sclera anicteric Neck: no JVD, carotid bruits, or masses Cardiac: RRR; no murmurs, rubs, or gallops,no edema  Respiratory: clear to auscultation bilaterally, normal work of breathing GI: soft, obese, nontender, nondistended, + BS MS: no deformity or atrophy Skin: warm and dry, no rash Neuro:  Strength and sensation are intact Psych: euthymic mood, full affect   EKG:  EKG is not ordered today.   Recent  Labs: 02/28/2020: ALT 56; BUN 31; Creatinine, Ser 1.85; Hemoglobin 9.6; Magnesium 2.1; Platelets 232; Potassium 5.5; Sodium 138    Lipid Panel    Component Value Date/Time   CHOL 162 12/14/2019 0930   TRIG 103 12/14/2019 0930   HDL 38 (L) 12/14/2019 0930   CHOLHDL 4.3 12/14/2019 0930   CHOLHDL 4 06/09/2016 1332   VLDL 23.0 06/09/2016 1332   LDLCALC 105 (H) 12/14/2019 0930      Wt Readings from Last 3 Encounters:  03/14/20 (!) 350 lb (158.8 kg)  03/14/20 (!) 347 lb 1.6 oz (157.4 kg)  02/27/20 (!) 366 lb 12.8 oz (166.4 kg)      Other studies Reviewed: Additional studies/ records that were reviewed today include: None.  ASSESSMENT AND PLAN:  1. Resistant HTN: BP 150/90 today. He is on maximum dose of amlodipine, carvedilol, and clonidine and recent AoCKD limits further titration of spironolactone or restarting lisinopril-HCTZ. He is hesitant to add additional medications (considering hydralazine) and would like to see how BP trends with ongoing weight loss following gastric sleeve surgery.  - Will have him follow-up with pharmacy for HTN visit in 1 month to determine if additional medication changes should occur.  - Continue amlodipine, carvedilol, clonidine, and spironolactone  2. HLD: LDL 105 12/2019, not currently on medications. Now s/p gastric sleeve - Continue dietary/lifestyle modifications to promote lower cholesterol levels  3. DM type 2: A1C 7.8 02/16/20; goal <7, not currently on medications. Now s/p gastric sleeve - Continue dietary/lifestyle modifications to improve A1C  4. OSA: on CPAP - Continue CPAP   5. Obesity: underwent gastric sleeve surgery 02/27/20.  - Continue dietary/lifesyle modification to promote weight loss  6. CKD stage 3: Cr 1.85 on last check 02/28/20 - Will repeat BMET today    Current medicines are reviewed at length with the patient today.  The patient does not have concerns regarding medicines.  The following changes have been made:   As above  Labs/ tests ordered today include:   Orders Placed This Encounter  Procedures  . Basic metabolic panel     Disposition:   FU with pharmacy for HTN check in 1 month and with Dr. Debara Pickett in 6 months  Signed, Abigail Butts, PA-C  03/14/2020 9:34 AM

## 2020-03-13 NOTE — Telephone Encounter (Signed)
Phone call to f/u with pt about large bruise to left side of abdomen post op.  Pt states that the "bruise is getting smaller" and that he is feeling "fine".  Will continue to monitor if needed.

## 2020-03-14 ENCOUNTER — Other Ambulatory Visit: Payer: Self-pay

## 2020-03-14 ENCOUNTER — Ambulatory Visit (INDEPENDENT_AMBULATORY_CARE_PROVIDER_SITE_OTHER): Payer: 59 | Admitting: Medical

## 2020-03-14 ENCOUNTER — Encounter: Payer: Self-pay | Admitting: Medical

## 2020-03-14 VITALS — BP 150/90 | HR 94 | Ht 75.0 in | Wt 350.0 lb

## 2020-03-14 DIAGNOSIS — E119 Type 2 diabetes mellitus without complications: Secondary | ICD-10-CM

## 2020-03-14 DIAGNOSIS — Z9989 Dependence on other enabling machines and devices: Secondary | ICD-10-CM

## 2020-03-14 DIAGNOSIS — G4733 Obstructive sleep apnea (adult) (pediatric): Secondary | ICD-10-CM

## 2020-03-14 DIAGNOSIS — I10 Essential (primary) hypertension: Secondary | ICD-10-CM | POA: Diagnosis not present

## 2020-03-14 DIAGNOSIS — E785 Hyperlipidemia, unspecified: Secondary | ICD-10-CM | POA: Diagnosis not present

## 2020-03-14 DIAGNOSIS — N1831 Chronic kidney disease, stage 3a: Secondary | ICD-10-CM

## 2020-03-14 LAB — BASIC METABOLIC PANEL
BUN/Creatinine Ratio: 10 (ref 9–20)
BUN: 17 mg/dL (ref 6–24)
CO2: 19 mmol/L — ABNORMAL LOW (ref 20–29)
Calcium: 9.5 mg/dL (ref 8.7–10.2)
Chloride: 105 mmol/L (ref 96–106)
Creatinine, Ser: 1.65 mg/dL — ABNORMAL HIGH (ref 0.76–1.27)
Glucose: 130 mg/dL — ABNORMAL HIGH (ref 65–99)
Potassium: 4.9 mmol/L (ref 3.5–5.2)
Sodium: 141 mmol/L (ref 134–144)
eGFR: 50 mL/min/{1.73_m2} — ABNORMAL LOW (ref >59)

## 2020-03-14 NOTE — Progress Notes (Signed)
2 Week Post-Operative Nutrition Class   Patient was seen on 03/02/18 for Post-Operative Nutrition education at [facility].    Surgery date: 02/27/2020 Surgery type: sleeve Start weight at [facility]: 387.7 Weight today: 380.6   Body Composition Scale 03/13/2020  Current Body Weight 347.1  Total Body Fat % 37.1  Visceral Fat 31  Fat-Free Mass % 62.8   Total Body Water % 43.8  Muscle-Mass lbs 65.5  BMI 42.8  Body Fat Displacement          Torso  lbs 80.2         Left Leg  lbs 16         Right Leg  lbs 16         Left Arm  lbs 8         Right Arm   lbs 8      The following the learning objectives were met by the patient during this course:  Identifies Phase 3 (Soft, High Proteins) Dietary Goals and will begin from 2 weeks post-operatively to 2 months post-operatively  Identifies appropriate sources of fluids and proteins   Identifies appropriate fat sources and healthy verses unhealthy fat types    States protein recommendations and appropriate sources post-operatively  Identifies the need for appropriate texture modifications, mastication, and bite sizes when consuming solids  Identifies appropriate multivitamin and calcium sources post-operatively  Describes the need for physical activity post-operatively and will follow MD recommendations  States when to call healthcare provider regarding medication questions or post-operative complications   Handouts given during class include:  Phase 3A: Soft, High Protein Diet Handout  Phase 3 High Protein Meals  Healthy Fats   Follow-Up Plan: Patient will follow-up at [facility] in 6 weeks for 2 month post-op nutrition visit for diet advancement per MD.

## 2020-03-14 NOTE — Patient Instructions (Addendum)
Medication Instructions:  Your physician recommends that you continue on your current medications as directed. Please refer to the Current Medication list given to you today.  *If you need a refill on your cardiac medications before your next appointment, please call your pharmacy*   Lab Work: Your physician recommends that you return for lab work TODAY:  BMET  If you have labs (blood work) drawn today and your tests are completely normal, you will receive your results only by: Marland Kitchen MyChart Message (if you have MyChart) OR . A paper copy in the mail If you have any lab test that is abnormal or we need to change your treatment, we will call you to review the results.  Testing/Procedures: NONE ordered at this time of appointment   Follow-Up: At [facility], you and your health needs are our priority.  As part of our continuing mission to provide you with exceptional heart care, we have created designated Provider Care Teams.  These Care Teams include your primary Cardiologist (physician) and Advanced Practice Providers (APPs -  Physician Assistants and Nurse Practitioners) who all work together to provide you with the care you need, when you need it.   Your next appointment:   1 month(s) 6 month(s)  The format for your next appointment: In Person  In Person  Provider:   [facility] Pharm D K. Mali Hilty, MD   Other Instructions  Continue to keep a log of your blood pressure at home (morning and night) to bring to your follow-up visit to determine if medication changes need to occur. Goal blood pressure is <130/80.

## 2020-03-19 ENCOUNTER — Telehealth: Payer: Self-pay | Admitting: Skilled Nursing Facility1

## 2020-03-19 NOTE — Telephone Encounter (Signed)
RD called pt to verify fluid intake once starting soft, solid proteins 2 week post-bariatric surgery.   Daily Fluid intake: 64 oz Daily Protein intake: 60 g  Concerns/issues:   No concerns stated 

## 2020-04-15 NOTE — Progress Notes (Signed)
Patient ID: AUDI WETTSTEIN                 DOB: March 11, 1970                      MRN: [id number]     HPI: George Patton is a 50 y.o. male patient of Dr. Debara Pickett referred by Roby Lofts, PA to HTN clinic. PMH significant for resistant hypertension, HLD, DM type 2, OSA on CPAP, and obesity s/p gastric sleeve surgery 02/27/20.   Seen by Dr. Debara Pickett 12/2019 for management of his HTN despite multiple medications. Reported elevated blood pressures with SBP in the 170s. A renin aldosterone ratio was obtained and elevated suggesting primary hyperaldosteronism and he was recommended to start spironolactone 25mg  daily. A repeat BMET was recommended in 2 weeks, however not completed. He underwent gastric sleeve surgery 02/27/20. Cr elevated to 1.98 02/28/20, up from 1.41 12/2019. His lisinopril-HCTZ was held at that time.   Last seen by Roby Lofts, PA on 03/14/20. BP was 150/90. Reports losing 20 lbs since gastric sleeve surgery. Following surgery and cessation of his lisinopril-HCTZ he has readings in the 130s-150s. We discussed possibly starting hydralazine to reach BP goal <130/80, however he would like to hold off and see how his symptoms improve with ongoing weight loss and exercise. He is hopeful to wean off medications in the coming months.   Today, patient reports improvement in home blood pressures. He has now lost 50 lbs since his gastric sleeve surgery. Checks BP twice daily ~2 hours after taking his medications using a bicep cuff. Brings log with him. Readings range from  98/74 to 146/80 with an average reading of 127/75. BP today in office is 134/76. Denies dizziness, headache, blurred vision, or swelling. Prior to gastric sleeve surgery, reports SBP was consistently in 170s-180s, so he is very pleased with this progress. He is hopeful to decrease his BP medications. He is followed by a nutritionist and participates in an exercise program at [facility] 3 times weekly.   Current HTN meds: amlodipine 10 mg daily  (evening), carvedilol 25 mg BID, clonidine 0.3 mg QHS, spironolactone 25 mg daily (evening) Previously tried: lisinopril-HCTZ 20-12.5 mg daily BP goal: <130/80  Family History: The patient's family history includes Asthma in his mother and son; Cancer in his father; Heart attack in his maternal grandfather; Hypertension in his mother; Migraines in his sister; Mitral valve prolapse in his mother; Stroke in his maternal grandmother.   Social History: The patient  reports that he quit smoking about 6 years ago. His smoking use included cigarettes. He smoked 0.25 packs per day. He has never used smokeless tobacco. He reports current alcohol use of about 1.0 standard drink of alcohol per week. He reports that he does not use drugs.   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[employer])  Home BP readings: Checks BP twice daily with bicep cuff, ~2 hours after taking medications Ranges from 98/74 to 146/80 with an average reading of 127/75  Labs:  12/14/19: Scr 1.41, K 3.8, aldosterone/renin ratio 59.2 (before spiro, on lisinopril-HCTZ) 02/28/20: Scr 1.98, K 4.5 (on spironolactone, lisinopril-HCTZ) 03/14/20: Scr 1.65, K 4.9 (on spironolactone)  Wt Readings from Last 3 Encounters:  03/14/20 (!) 350 lb (158.8 kg)  03/14/20 (!) 347 lb 1.6 oz (  157.4 kg)  02/27/20 (!) 366 lb 12.8 oz (166.4 kg)   BP Readings from Last 3 Encounters:  03/14/20 (!) 150/90  02/28/20 (!) 147/78  02/16/20 (!) 189/89   Pulse Readings from Last 3 Encounters:  03/14/20 94  02/28/20 84  02/16/20 89    Renal function: CrCl cannot be calculated (Patient's most recent lab result is older than the maximum 21 days allowed.).  Past Medical History:  Diagnosis Date  . Allergy   . Anxiety   . Arthritis    hips knees  . Asthma   . Depression   . Diabetes mellitus  without complication ([place])   . Hypertension   . OSA on CPAP    No longer needs CPAP machine had a repeat sleep study    Current Outpatient Medications on File Prior to Visit  Medication Sig Dispense Refill  . albuterol (PROVENTIL HFA;VENTOLIN HFA) 108 (90 BASE) MCG/ACT inhaler Inhale 2 puffs into the lungs every 6 (six) hours as needed for wheezing or shortness of breath. 1 Inhaler 2  . amLODipine (NORVASC) 10 MG tablet Take 10 mg by mouth every evening.    Marland Kitchen buPROPion (WELLBUTRIN XL) 150 MG 24 hr tablet Take 150 mg by mouth daily.    . carvedilol (COREG) 25 MG tablet Take 25 mg by mouth 2 (two) times daily.    . cloNIDine (CATAPRES) 0.3 MG tablet Take 0.3 mg by mouth every evening.    Marland Kitchen EPINEPHrine (EPIPEN 2-PAK) 0.3 mg/0.3 mL IJ SOAJ injection Inject 0.3 mLs (0.3 mg total) into the muscle once. Repeat if needed 2 Device 1  . fluticasone (FLONASE) 50 MCG/ACT nasal spray Place 1 spray into both nostrils as needed.    Marland Kitchen levocetirizine (XYZAL) 5 MG tablet Take 5 mg by mouth every evening.    . ondansetron (ZOFRAN-ODT) 4 MG disintegrating tablet DISSOLVE 1 TABLET IN THE MOUTH EVERY 6 HOURS AS NEEDED FOR NAUSEA OR VOMITING 20 tablet 0  . pantoprazole (PROTONIX) 40 MG tablet TAKE 1 TABLET BY MOUTH ONCE A DAY 90 tablet 0  . Probiotic CAPS Take 1 capsule by mouth every evening.    . sildenafil (VIAGRA) 100 MG tablet Take 100 mg by mouth as needed for erectile dysfunction.    Marland Kitchen spironolactone (ALDACTONE) 25 MG tablet Take 1 tablet (25 mg total) by mouth every evening. 1 tablet 0  . Vitamin D, Ergocalciferol, (DRISDOL) 1.25 MG (50000 UNIT) CAPS capsule Take 50,000 Units by mouth every Monday.    . [DISCONTINUED] gabapentin (NEURONTIN) 100 MG capsule Take 2 capsules (200 mg total) by mouth every 12 (twelve) hours. 20 capsule 0   No current facility-administered medications on file prior to visit.    Allergies  Allergen Reactions  . Shellfish Allergy Anaphylaxis    There were no vitals taken for  this visit.  Hypertension Blood pressure in office today of 134/76 is nearly at goal <130/80 mmHg. Average of 38 home readings from patient's BP log is 127/75. BP is improving with patient's weight loss s/p gastric sleeve surgery. Expect that BP will continue to improve over the next few months as well. Will wean clonidine off--take 1/2 tablet (0.15 mg) daily for one week then 1/2 tablet (0.15 mg) every other day for one week, then discontinue. Patient instructed to continue monitoring his BP to ensure it does not rebound with discontinuation. Continue amlodipine 10 mg daily, spironolactone 25 mg daily, and carvedilol 25 mg BID. Scr trending down since discontinuation of lisinopril-HCTZ. Will recheck a BMET  today to see if his Scr is back to baseline. Consider initiating low dose lisinopril 5 mg daily for renal protection in T2DM at next visit. Follow up office visit in 6 weeks.  Rebbeca Paul, PharmD PGY1 Pharmacy Resident 04/16/2020 10:24 AM

## 2020-04-16 ENCOUNTER — Ambulatory Visit (INDEPENDENT_AMBULATORY_CARE_PROVIDER_SITE_OTHER): Payer: 59 | Admitting: Pharmacist Clinician (PhC)/ Clinical Pharmacy Specialist

## 2020-04-16 ENCOUNTER — Other Ambulatory Visit: Payer: Self-pay

## 2020-04-16 VITALS — BP 134/76 | HR 70

## 2020-04-16 DIAGNOSIS — I10 Essential (primary) hypertension: Secondary | ICD-10-CM

## 2020-04-16 LAB — BASIC METABOLIC PANEL
BUN/Creatinine Ratio: 10 (ref 9–20)
BUN: 16 mg/dL (ref 6–24)
CO2: 23 mmol/L (ref 20–29)
Calcium: 9.5 mg/dL (ref 8.7–10.2)
Chloride: 102 mmol/L (ref 96–106)
Creatinine, Ser: 1.58 mg/dL — ABNORMAL HIGH (ref 0.76–1.27)
Glucose: 113 mg/dL — ABNORMAL HIGH (ref 65–99)
Potassium: 4.5 mmol/L (ref 3.5–5.2)
Sodium: 140 mmol/L (ref 134–144)
eGFR: 53 mL/min/{1.73_m2} — ABNORMAL LOW (ref >59)

## 2020-04-16 NOTE — Patient Instructions (Addendum)
It was nice to see you today!  Your goal blood pressure is less than 130/80 mmHg. In clinic, your blood pressure was 134/76 mmHg.  Medication Changes: Take one half tablet (0.15 mg) of clonidine daily for 1 week, then reduce to one half tablet (0.15 mg) every other day for 1 week, then discontinue clonidine.   Continue amlodipine 10 mg daily, carvedilol 25 mg twice daily, spironolactone 25 mg daily.   Monitor blood pressure at home daily and keep a log (on your phone or piece of paper) to bring with you to your next visit. Write down date, time, blood pressure and pulse.  Keep up the good work with diet and exercise. Aim for a diet full of vegetables, fruit and lean meats (chicken, [place], fish). Try to limit salt intake by eating fresh or frozen vegetables (instead of canned), rinse canned vegetables prior to cooking and do not add any additional salt to meals.    We are checking lab work today to look at your kidney function.   Follow up office visit in 6 weeks.

## 2020-04-16 NOTE — Assessment & Plan Note (Addendum)
Blood pressure in office today of 134/76 is nearly at goal <130/80 mmHg. Average of 38 home readings from patient's BP log is 127/75. BP is improving with patient's weight loss s/p gastric sleeve surgery. Expect that BP will continue to improve over the next few months as well. Will wean clonidine off--take 1/2 tablet (0.15 mg) daily for one week then 1/2 tablet (0.15 mg) every other day for one week, then discontinue. Patient instructed to continue monitoring his BP to ensure it does not rebound with discontinuation. Continue amlodipine 10 mg daily, spironolactone 25 mg daily, and carvedilol 25 mg BID. Scr trending down since discontinuation of lisinopril-HCTZ. Will recheck a BMET today to see if his Scr is back to baseline. Consider initiating low dose lisinopril 5 mg daily for renal protection in T2DM at next visit. Follow up office visit in 6 weeks.

## 2020-04-17 ENCOUNTER — Encounter: Payer: Self-pay | Admitting: Pharmacist Clinician (PhC)/ Clinical Pharmacy Specialist

## 2020-04-24 ENCOUNTER — Encounter: Payer: 59 | Attending: Surgery | Admitting: Skilled Nursing Facility1

## 2020-04-24 ENCOUNTER — Other Ambulatory Visit: Payer: Self-pay

## 2020-04-24 DIAGNOSIS — E669 Obesity, unspecified: Secondary | ICD-10-CM | POA: Diagnosis not present

## 2020-04-24 DIAGNOSIS — E119 Type 2 diabetes mellitus without complications: Secondary | ICD-10-CM

## 2020-04-24 NOTE — Progress Notes (Signed)
Bariatric Nutrition Follow-Up Visit Medical Nutrition Therapy    NUTRITION ASSESSMENT    Anthropometrics  Surgery date: 02/27/2020 Surgery type: sleeve Start weight at [facility]: 387.7 Weight today: 321.7   Body Composition Scale 03/13/2020 04/24/2020  Current Body Weight 347.1 321.7  Total Body Fat % 37.1 34.9  Visceral Fat 31 27  Fat-Free Mass % 62.8 65   Total Body Water % 43.8 46  Muscle-Mass lbs 65.5 60.7  BMI 42.8 39.7  Body Fat Displacement           Torso  lbs 80.2 69.8         Left Leg  lbs 16 13.9         Right Leg  lbs 16 13.9         Left Arm  lbs 8 6.9         Right Arm   lbs 8 6.9   Clinical  Medical hx: DM, OSA Medications: reduced clonidine  Labs: creatinine    Lifestyle & Dietary Hx  Pt states his energy level has been better. Pt states work has been extremely stressful due to the season and losing some staff. Pt states he has been trying really hard to eat and drink throughout the day.  Pt states he checks his blood sugars in the morning and they have been around 105-115.   Estimated daily fluid intake: -64 oz Estimated daily protein intake: 80 g Supplements: multi and sometimes missing the calcium  Current average weekly physical activity: BELT  24-Hr Dietary Recall First Meal: 1 egg 1 [place] sausage + cheese Snack: cheese stick or greek yogurt Second Meal: chicken + black beans Snack: cheese stick or sugar free jello or sugar free pudding Third Meal: chicken + beans or edamame  Snack: sugar free pudding Beverages: water, protein shake  Post-Op Goals/ Signs/ Symptoms Using straws: no Drinking while eating: no Chewing/swallowing difficulties: no Changes in vision: no Changes to mood/headaches: no Hair loss/changes to skin/nails: no Difficulty focusing/concentrating: no Sweating: no Dizziness/lightheadedness: no Palpitations: no  Carbonated/caffeinated beverages: no N/V/D/C/Gas: daily mirilax Abdominal pain: no Dumping syndrome: no     NUTRITION DIAGNOSIS  Overweight/obesity ([place]-3.3) related to past poor dietary habits and physical inactivity as evidenced by completed bariatric surgery and following dietary guidelines for continued weight loss and healthy nutrition status.     NUTRITION INTERVENTION Nutrition counseling (C-1) and education (E-2) to facilitate bariatric surgery goals, including: . Diet advancement to the next phase (phase 4) now including non starchy vegetables  . The importance of consuming adequate calories as well as certain nutrients daily due to the body's need for essential vitamins, minerals, and fats . The importance of daily physical activity and to reach a goal of at least 150 minutes of moderate to vigorous physical activity weekly (or as directed by their physician) due to benefits such as increased musculature and improved lab values . The importance of intuitive eating specifically learning hunger-satiety cues and understanding the importance of learning a new body: The importance of mindful eating to avoid grazing behaviors   Goals: -swicth the to the bariatric advantage ultra solo with iron -Continue to aim for a minimum of 64 fluid ounces 7 days a week with at least 30 ounces being plain water -Eat non-starchy vegetables 2 times a day 7 days a week -Start out with soft cooked vegetables today and tomorrow; if tolerated begin to eat raw vegetables or cooked including salads -Eat your 3 ounces of protein first then start in  on your non-starchy vegetables; once you understand how much of your meal leads to satisfaction and not full while still eating 3 ounces of protein and non-starchy vegetables you can eat them in any order  -Continue to aim for 30 minutes of activity at least 5 times a week -Do NOT cook with/add to your food: alfredo sauce, cheese sauce, barbeque sauce, ketchup, fat back, butter, bacon grease, grease, Crisco, OR SUGAR  Handouts Provided Include   Phase 4  Learning Style &  Readiness for Change Teaching method utilized: Visual & Auditory  Demonstrated degree of understanding via: Teach Back  Readiness Level: Action Barriers to learning/adherence to lifestyle change: none identified  RD's Notes for Next Visit . Assess adherence to pt chosen goals   MONITORING & EVALUATION Dietary intake, weekly physical activity, body weight  Next Steps Patient is to follow-up in 3 months

## 2020-05-25 NOTE — Progress Notes (Signed)
Patient ID: YOSIEL THIEME                 DOB: 1970/04/06                      MRN: [id number]     HPI: EXAVIER LINA is a 50 y.o. male patient of Dr. Debara Pickett referred by Roby Lofts, PA to HTN clinic. PMH significant for resistant hypertension, HLD, DM type 2, OSA on CPAP, and obesity s/p gastric sleeve surgery 02/27/20.    Seen by Dr. Debara Pickett 12/2019 for management of his HTN despite multiple medications. Reported elevated blood pressures with SBP in the 170s. A renin aldosterone ratio was obtained and elevated suggesting primary hyperaldosteronism and he was recommended to start spironolactone 25mg  daily. A repeat BMET was recommended in 2 weeks, however not completed. He underwent gastric sleeve surgery 02/27/20. Cr elevated to 1.98 02/28/20, up from 1.41 12/2019. His lisinopril-HCTZ was held at that time.   Last seen by Roby Lofts, PA on 03/14/20. BP was 150/90. Reports losing 20 lbs since gastric sleeve surgery. Following surgery and cessation of his lisinopril-HCTZ he has readings in the 130s-150s. Discussed possibly starting hydralazine to reach BP goal <130/80, however he would like to hold off and see how his symptoms improve with ongoing weight loss and exercise. He is hopeful to wean off medications in the coming months.   At last pharmacist visit on 04/16/20, pt has now lost 50 lbs since his gastric sleeve surgery. Average home BP reading was 127/75. BP in office was 134/76. Prior to gastric sleeve surgery, reports SBP consistently 170s-180s, so he is very pleased with this progress. He is hopeful to decrease his BP medications as he continues to lose more weight. Given improved BP control, clonidine was tapered off and discontinued. Amlodipine, spironolactone, carvedilol continued at current doses. Bmet showed Scr has improved to 1.58.    Today, patient presents for 6 week follow up. He continues to lose weight, now down 60 lbs since his gastric sleeve surgery. He tapered off clonidine and has  had no issues since discontinuing. Denies dizziness, headache, blurred vision, or swelling. Home BP readings are 120s/70s on average. BP in office today was initially 150/98. States he has not seen any numbers this high at home. Repeat BP at the end of the visit was 132/84.   Current HTN meds: amlodipine 10 mg daily (evening), carvedilol 25 mg BID, spironolactone 25 mg daily (evening) Previously tried: lisinopril-HCTZ 20-12.5 mg daily, clonidine 0.3 mg QHS BP goal: <130/80 mmHg  Family History: The patient's family history includes Asthma in his mother and son; Cancer in his father; Heart attack in his maternal grandfather; Hypertension in his mother; Migraines in his sister; Mitral valve prolapse in his mother; Stroke in his maternal grandmother.    Social History: The patient reports that he quit smoking about 6 years ago. His smoking use included cigarettes. He smoked 0.25 packs per day. He has never used smokeless tobacco. He reports current alcohol use of about 1.0 standard drink of alcohol per week. He reports that he does not use drugs.   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[employer])  Home BP readings: Checks BP twice daily with bicep cuff, ~2 hours after taking medications Average readings 120s/70s  Labs:  12/14/19: Scr 1.41, K 3.8, aldosterone/renin ratio 59.2 (before spiro, on lisinopril-HCTZ) 02/28/20: Scr 1.98, K 4.5 (on spironolactone, lisinopril-HCTZ) 03/14/20: Scr 1.65, K 4.9 (on spironolactone) 04/16/20: Scr 1.58, K 4.5 (on spironolactone)  Wt Readings from Last 3 Encounters:  05/28/20 (!) 312 lb (141.5 kg)  04/24/20 (!) 321 lb 11.2 oz (145.9 kg)  03/14/20 (!) 350 lb (158.8 kg)   BP Readings from Last 3 Encounters:  05/28/20 132/84  04/16/20 134/76  03/14/20 (!) 150/90   Pulse Readings from Last 3 Encounters:   05/28/20 68  04/16/20 70  03/14/20 94    Renal function: CrCl cannot be calculated (Patient's most recent lab result is older than the maximum 21 days allowed.).  Past Medical History:  Diagnosis Date  . Allergy   . Anxiety   . Arthritis    hips knees  . Asthma   . Depression   . Diabetes mellitus without complication ([place])   . Hypertension   . OSA on CPAP    No longer needs CPAP machine had a repeat sleep study    Current Outpatient Medications on File Prior to Visit  Medication Sig Dispense Refill  . albuterol (PROVENTIL HFA;VENTOLIN HFA) 108 (90 BASE) MCG/ACT inhaler Inhale 2 puffs into the lungs every 6 (six) hours as needed for wheezing or shortness of breath. 1 Inhaler 2  . amLODipine (NORVASC) 10 MG tablet Take 10 mg by mouth every evening.    Marland Kitchen buPROPion (WELLBUTRIN XL) 150 MG 24 hr tablet Take 150 mg by mouth daily.    . carvedilol (COREG) 25 MG tablet Take 25 mg by mouth 2 (two) times daily.    . cloNIDine (CATAPRES) 0.3 MG tablet Take 0.3 mg by mouth every evening.    Marland Kitchen EPINEPHrine (EPIPEN 2-PAK) 0.3 mg/0.3 mL IJ SOAJ injection Inject 0.3 mLs (0.3 mg total) into the muscle once. Repeat if needed 2 Device 1  . fluticasone (FLONASE) 50 MCG/ACT nasal spray Place 1 spray into both nostrils as needed.    Marland Kitchen levocetirizine (XYZAL) 5 MG tablet Take 5 mg by mouth every evening.    . ondansetron (ZOFRAN-ODT) 4 MG disintegrating tablet DISSOLVE 1 TABLET IN THE MOUTH EVERY 6 HOURS AS NEEDED FOR NAUSEA OR VOMITING 20 tablet 0  . pantoprazole (PROTONIX) 40 MG tablet TAKE 1 TABLET BY MOUTH ONCE A DAY 90 tablet 0  . Probiotic CAPS Take 1 capsule by mouth every evening.    . sildenafil (VIAGRA) 100 MG tablet Take 100 mg by mouth as needed for erectile dysfunction.    Marland Kitchen spironolactone (ALDACTONE) 25 MG tablet Take 1 tablet (25 mg total) by mouth every evening. 1 tablet 0  . Vitamin D, Ergocalciferol, (DRISDOL) 1.25 MG (50000 UNIT) CAPS capsule Take 50,000 Units by mouth every Monday.     . [DISCONTINUED] gabapentin (NEURONTIN) 100 MG capsule Take 2 capsules (200 mg total) by mouth every 12 (twelve) hours. 20 capsule 0   No current facility-administered medications on file prior to visit.    Allergies  Allergen Reactions  . Shellfish Allergy Anaphylaxis    There were no vitals taken for this visit.  Hypertension Repeat blood pressure in office today of 132/84 is nearly at goal <130/80 mmHg. Average readings at home are 120s/70s, which continues to be at goal after discontinuation of clonidine at last visit. Expect that BP will continue to improve as he continues to lose weight, and he is motivated to continue decreasing his HTN medications. With BP nearly at goal in office  and home BP readings at goal, will now decrease carvedilol to 12.5 mg BID. Counseled patient that he may split his current 25 mg tablets in half until he fills the new prescription. Encouraged him to continue monitoring his BP daily and to bring a log to his next visit. Could consider initiating low dose ACE inhibitor for renal protecting in T2DM at future visits. Follow up office visit in the [facility] office in 2 months. Instructed him to call if BP drops too low or starts to rise after decreasing carvedilol dose.   Rebbeca Paul, PharmD PGY1 Pharmacy Resident 05/28/2020 11:15 AM

## 2020-05-28 ENCOUNTER — Ambulatory Visit (INDEPENDENT_AMBULATORY_CARE_PROVIDER_SITE_OTHER): Payer: 59 | Admitting: Pharmacist Clinician (PhC)/ Clinical Pharmacy Specialist

## 2020-05-28 ENCOUNTER — Other Ambulatory Visit: Payer: Self-pay

## 2020-05-28 VITALS — BP 132/84 | HR 68 | Resp 16 | Ht 75.5 in | Wt 312.0 lb

## 2020-05-28 DIAGNOSIS — I10 Essential (primary) hypertension: Secondary | ICD-10-CM

## 2020-05-28 MED ORDER — CARVEDILOL 12.5 MG PO TABS: 12.5000 mg | ORAL_TABLET | Freq: Two times a day (BID) | ORAL | 3 refills | Status: AC

## 2020-05-28 NOTE — Patient Instructions (Addendum)
It was nice to see you today!  Your goal blood pressure is less than 130/80 mmHg. In clinic, your blood pressure was 132/84 mmHg.  Medication Changes: Decrease your carvedilol to 12.5 mg twice daily. You can split your current tablets in half until you need to refill. We have sent in a new prescription for 12.5 mg tablets so once you refill it will just be one whole tablet.   Continue amlodipine 10 mg daily and spironolactone 25 mg daily  Monitor blood pressure at home daily and keep a log (on your phone or piece of paper) to bring with you to your next visit. Write down date, time, blood pressure and pulse.  Keep up the good work with diet and exercise. Aim for a diet full of vegetables, fruit and lean meats (chicken, [place], fish). Try to limit salt intake by eating fresh or frozen vegetables (instead of canned), rinse canned vegetables prior to cooking and do not add any additional salt to meals.   Please give [place] with any questions or concerns.  Follow up office visit on July 26th in our [employer].

## 2020-05-28 NOTE — Assessment & Plan Note (Signed)
Repeat blood pressure in office today of 132/84 is nearly at goal <130/80 mmHg. Average readings at home are 120s/70s, which continues to be at goal after discontinuation of clonidine at last visit. Expect that BP will continue to improve as he continues to lose weight, and he is motivated to continue decreasing his HTN medications. With BP nearly at goal in office and home BP readings at goal, will now decrease carvedilol to 12.5 mg BID. Counseled patient that he may split his current 25 mg tablets in half until he fills the new prescription. Encouraged him to continue monitoring his BP daily and to bring a log to his next visit. Could consider initiating low dose ACE inhibitor for renal protecting in T2DM at future visits. Follow up office visit in the [facility] office in 2 months. Instructed him to call if BP drops too low or starts to rise after decreasing carvedilol dose.

## 2020-05-29 ENCOUNTER — Encounter: Payer: Self-pay | Admitting: Pharmacist Clinician (PhC)/ Clinical Pharmacy Specialist

## 2020-07-17 ENCOUNTER — Other Ambulatory Visit: Payer: Self-pay

## 2020-07-17 ENCOUNTER — Encounter: Payer: 59 | Attending: Surgery | Admitting: Skilled Nursing Facility1

## 2020-07-17 DIAGNOSIS — E119 Type 2 diabetes mellitus without complications: Secondary | ICD-10-CM | POA: Insufficient documentation

## 2020-07-17 DIAGNOSIS — E669 Obesity, unspecified: Secondary | ICD-10-CM | POA: Insufficient documentation

## 2020-07-19 NOTE — Progress Notes (Signed)
Follow-up visit:  Post-Operative sleeve Surgery  Medical Nutrition Therapy:  Appt start time: 6:00pm end time:  7:00pm  Primary concerns today: Post-operative Bariatric Surgery Nutrition Management 6 Month Post-Op Class  Anthropometrics  Surgery date: 02/27/2020 Surgery type: sleeve Start weight at [facility]: 387.7 Weight today: 301.1 pounds   Body Composition Scale 03/13/2020 04/24/2020 07/19/2020  Current Body Weight 347.1 321.7 301.1  Total Body Fat % 37.1 34.9 32.9  Visceral Fat 31 27 24   Fat-Free Mass % 62.8 65 67   Total Body Water % 43.8 46 48  Muscle-Mass lbs 65.5 60.7 56.7  BMI 42.8 39.7 37.1  Body Fat Displacement            Torso  lbs 80.2 69.8 61.5         Left Leg  lbs 16 13.9 12.3         Right Leg  lbs 16 13.9 12.3         Left Arm  lbs 8 6.9 6.1         Right Arm   lbs 8 6.9 6.1      Information Reviewed/ Discussed During Appointment: -Review of composition scale numbers -Fluid requirements (64-100 ounces) -Protein requirements (60-80g) -Strategies for tolerating diet -Advancement of diet to include Starchy vegetables -Barriers to inclusion of new foods -Inclusion of appropriate multivitamin and calcium supplements  -Exercise recommendations   Fluid intake: adequate   Medications: See List Supplementation: appropriate   CBG monitoring: once a week Average CBG per patient: 104-110 Last patient reported A1c: 6.7  Using straws: no Drinking while eating: no Having you been chewing well: yes Chewing/swallowing difficulties: no Changes in vision: no Changes to mood/headaches: no Hair loss/Cahnges to skin/Changes to nails: no Any difficulty focusing or concentrating: no Sweating: no Dizziness/Lightheaded: no Palpitations: no  Carbonated beverages: no N/V/D/C/GAS: no Abdominal Pain: no Dumping syndrome: no  Recent physical activity:  ADL's  Progress Towards Goal(s):  In Progress Teaching method utilized: Visual & Auditory  Demonstrated degree of  understanding via: Teach Back  Readiness Level: Action Barriers to learning/adherence to lifestyle change: none identified  Handouts given during visit include: Phase V diet Progression  Goals Sheet The Benefits of Exercise are endless..... Support Group Topics   Teaching Method Utilized:  Visual Auditory Hands on  Demonstrated degree of understanding via:  Teach Back   Monitoring/Evaluation:  Dietary intake, exercise, and body weight. Follow up in 3 months for 9 month post-op visit.

## 2020-07-30 NOTE — Progress Notes (Unsigned)
Patient ID: ARKEL CANCELLIERE                 DOB: [birth year]                      MRN: [id number]     HPI: George Patton is a 50 y.o. male patient of Dr. Debara Pickett referred by Roby Lofts, PA to HTN clinic. PMH significant for resistant hypertension, HLD, DM type 2, OSA on CPAP, and obesity s/p gastric sleeve surgery 02/27/20.    Seen by Dr. Debara Pickett 12/2019 for management of his HTN despite multiple medications. Reported elevated blood pressures with SBP in the 170s. Renin aldosterone ratio was elevated suggesting primary hyperaldosteronism and he was started on spironolactone '25mg'$  daily. A repeat bmet was recommended in 2 weeks, however not completed. He underwent gastric sleeve surgery 02/27/20. Cr elevated to 1.98 02/28/20, up from 1.41 12/2019. His lisinopril-HCTZ was held at that time. At 03/14/20 visit with Roby Lofts, BP was 150/90 and he reported home BP improvements after losing 20 lbs since gastric sleeve surgery. Starting hydralazine was discussed but patient wanted to hold off and see how his BP improved with ongoing weight loss and exercise. He is hopeful to wean off medications in the coming months.   Patient has now seen pharmacy clinic twice, 04/16/20 and 05/28/20. He was down 60 lbs since gastric sleeve surgery and BP had improved nearly to goal in office with home readings at goal. He has now been tapered off of clonidine without issue and carvedilol has been decreased to 12.5 mg BID. Bmet showed Scr has improved to 1.58.    Today, *** -updated weight loss? Get weight today -changes in diet/exercise? Still doing [facility]g program? Compliance? Took meds this morning? Dizziness, headaches, blurred vision, swelling? Check Clinic BP? Home BP logs? If no logs, bring to next visit w/ BP cuff Additional BP therapy if needed Have discussed starting low dose lisinopril or losartan with him in the past but he has not been interested   Current HTN meds: amlodipine 10 mg daily (evening), carvedilol 25 mg  BID, spironolactone 25 mg daily (evening) Previously tried: lisinopril-HCTZ 20-12.5 mg daily (stopped due to bump in scr), clonidine 0.3 mg QHS (discontinued due to improved BP control) BP goal: <130/80 mmHg  Family History: The patient's family history includes Asthma in his mother and son; Cancer in his father; Heart attack in his maternal grandfather; Hypertension in his mother; Migraines in his sister; Mitral valve prolapse in his mother; Stroke in his maternal grandmother.    Social History: The patient reports that he quit smoking about 6 years ago. His smoking use included cigarettes. He smoked 0.25 packs per day. He has never used smokeless tobacco. He reports current alcohol use of about 1.0 standard drink of alcohol per week. He reports that he does not use drugs.   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[employer])  Home BP readings: Checks BP twice daily with bicep cuff, ~2 hours after taking medications Average readings 120s/70s  Labs:  12/14/19: Scr 1.41, K 3.8, aldosterone/renin ratio 59.2 (before spiro, on lisinopril-HCTZ) 02/28/20: Scr 1.98, K 4.5 (on spironolactone, lisinopril-HCTZ) 03/14/20: Scr 1.65, K 4.9 (on spironolactone) 04/16/20: Scr 1.58, K 4.5 (on spironolactone)  Wt Readings from Last 3 Encounters:  07/19/20 Marland Kitchen)  301 lb 1.6 oz (136.6 kg)  05/28/20 (!) 312 lb (141.5 kg)  04/24/20 (!) 321 lb 11.2 oz (145.9 kg)   BP Readings from Last 3 Encounters:  05/28/20 132/84  04/16/20 134/76  03/14/20 (!) 150/90   Pulse Readings from Last 3 Encounters:  05/28/20 68  04/16/20 70  03/14/20 94    Renal function: CrCl cannot be calculated (Patient's most recent lab result is older than the maximum 21 days allowed.).  Past Medical History:  Diagnosis Date   Allergy    Anxiety    Arthritis    hips knees    Asthma    Depression    Diabetes mellitus without complication (HCC)    Hypertension    OSA on CPAP    No longer needs CPAP machine had a repeat sleep study    Current Outpatient Medications on File Prior to Visit  Medication Sig Dispense Refill   albuterol (PROVENTIL HFA;VENTOLIN HFA) 108 (90 BASE) MCG/ACT inhaler Inhale 2 puffs into the lungs every 6 (six) hours as needed for wheezing or shortness of breath. 1 Inhaler 2   amLODipine (NORVASC) 10 MG tablet Take 10 mg by mouth every evening.     buPROPion (WELLBUTRIN XL) 150 MG 24 hr tablet Take 150 mg by mouth daily.     carvedilol (COREG) 12.5 MG tablet Take 1 tablet (12.5 mg total) by mouth 2 (two) times daily. 180 tablet 3   EPINEPHrine (EPIPEN 2-PAK) 0.3 mg/0.3 mL IJ SOAJ injection Inject 0.3 mLs (0.3 mg total) into the muscle once. Repeat if needed 2 Device 1   fluticasone (FLONASE) 50 MCG/ACT nasal spray Place 1 spray into both nostrils as needed.     levocetirizine (XYZAL) 5 MG tablet Take 5 mg by mouth every evening.     ondansetron (ZOFRAN-ODT) 4 MG disintegrating tablet DISSOLVE 1 TABLET IN THE MOUTH EVERY 6 HOURS AS NEEDED FOR NAUSEA OR VOMITING 20 tablet 0   pantoprazole (PROTONIX) 40 MG tablet TAKE 1 TABLET BY MOUTH ONCE A DAY 90 tablet 0   Probiotic CAPS Take 1 capsule by mouth every evening.     sildenafil (VIAGRA) 100 MG tablet Take 100 mg by mouth as needed for erectile dysfunction.     spironolactone (ALDACTONE) 25 MG tablet Take 1 tablet (25 mg total) by mouth every evening. 1 tablet 0   Vitamin D, Ergocalciferol, (DRISDOL) 1.25 MG (50000 UNIT) CAPS capsule Take 50,000 Units by mouth every Monday.     [DISCONTINUED] gabapentin (NEURONTIN) 100 MG capsule Take 2 capsules (200 mg total) by mouth every 12 (twelve) hours. 20 capsule 0   No current facility-administered medications on file prior to visit.    Allergies  Allergen Reactions   Shellfish Allergy Anaphylaxis    There were no vitals taken for this  visit.  Hypertension -

## 2020-07-31 ENCOUNTER — Ambulatory Visit: Payer: 59

## 2020-09-24 IMAGING — CR DG UGI W SINGLE CM
9 series · 12 of 12 positions shown · non-contrast
Comparison: None.

CLINICAL DATA: Preop bariatric surgery.

EXAM:
UPPER GI SERIES WITH KUB
TECHNIQUE: After obtaining a scout radiograph a routine upper GI series was
performed using thin barium
FLUOROSCOPY TIME:  Fluoroscopy Time:  1 minutes 12 seconds
Radiation Exposure Index (if provided by the fluoroscopic device):
190 mGy
Number of Acquired Spot Images: 7

[t abdomen supine (1 of 2)]
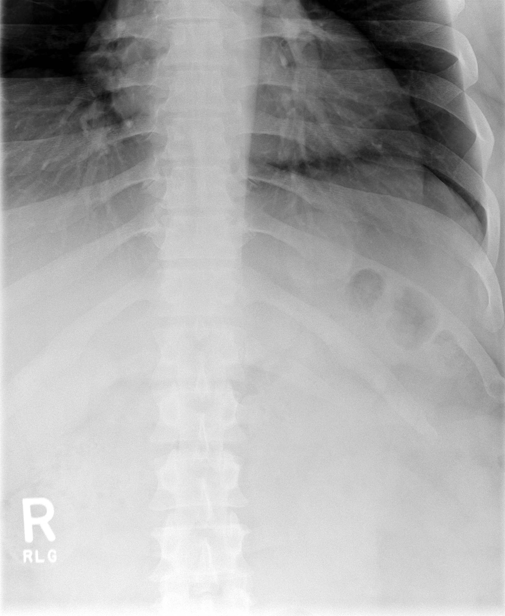

[t abdomen supine (2 of 2)]
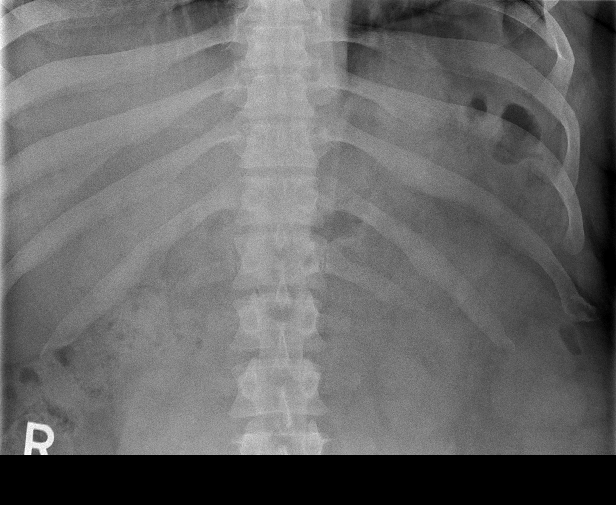

[fluoro_barium 2fps_bw (1 of 7)]
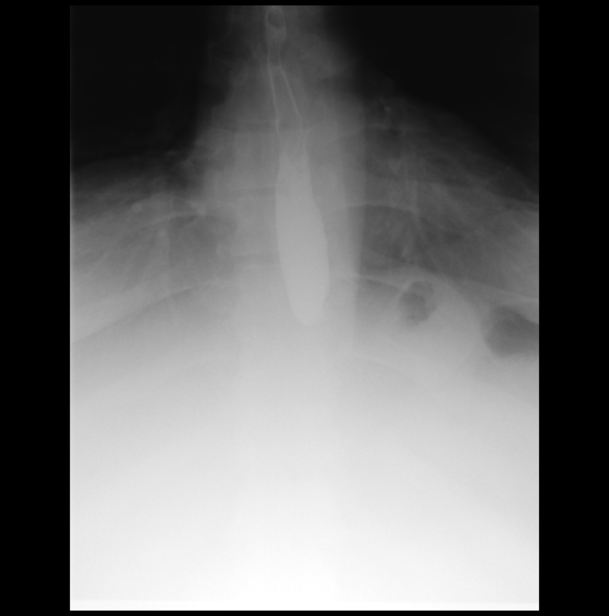

[fluoro_barium 2fps_bw (2 of 7)]
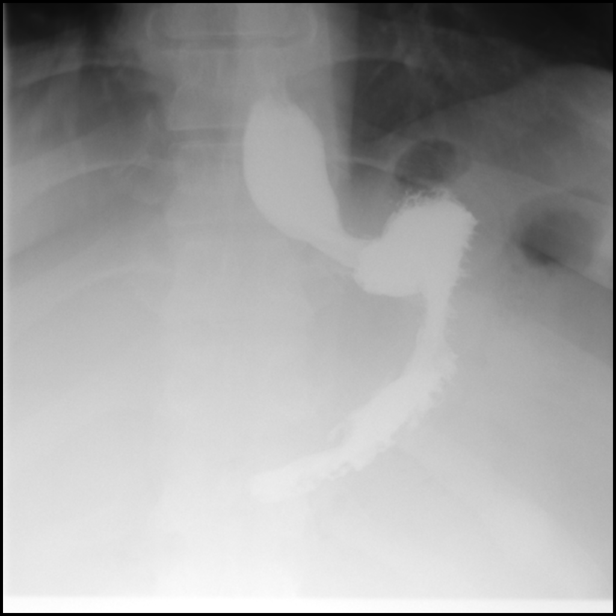

[Series 5: fluoro_barium 2fps_bw · 0.17mm/px · 2 of 2 frames shown (3 of 7)]
[frame 1/2]
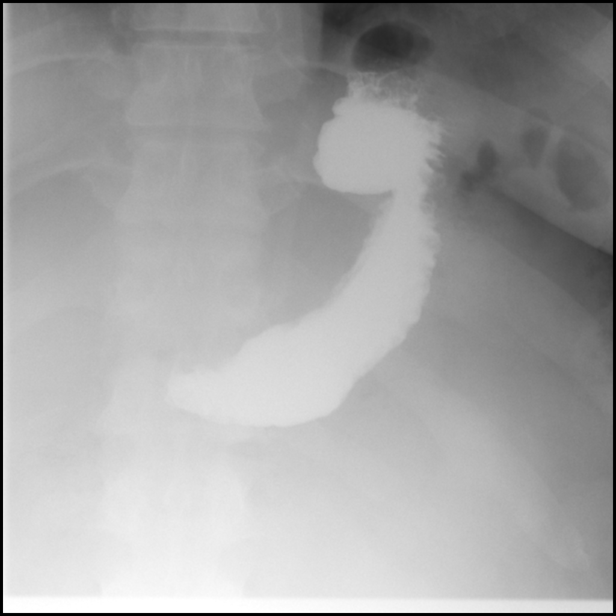
[frame 2/2]
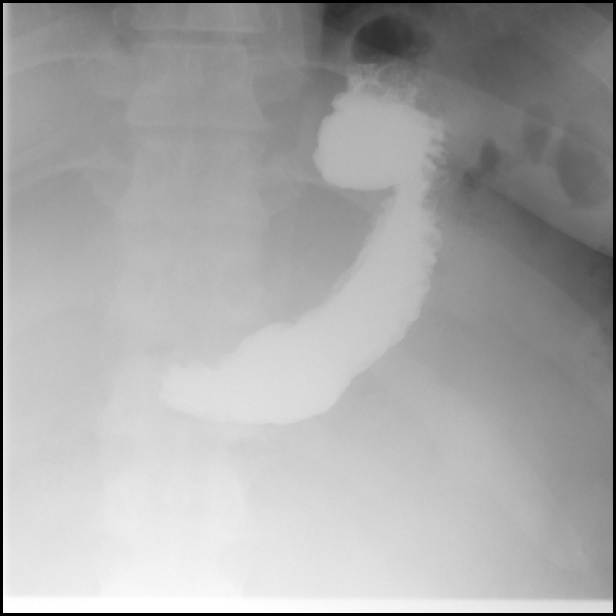

[fluoro_barium 2fps_bw (4 of 7)]
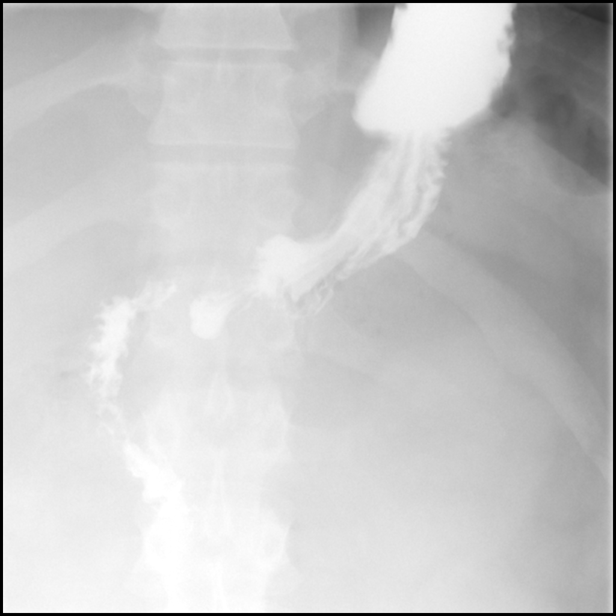

[Series 7: fluoro_barium 2fps_bw · 0.18mm/px · 2 of 2 frames shown (5 of 7)]
[frame 1/2]
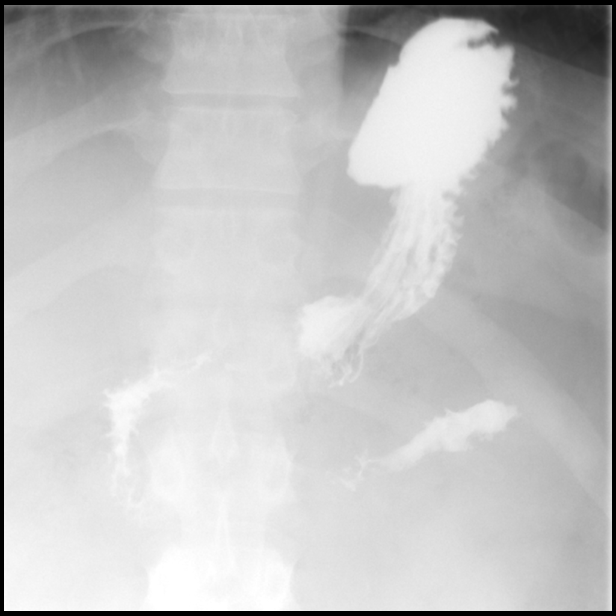
[frame 2/2]
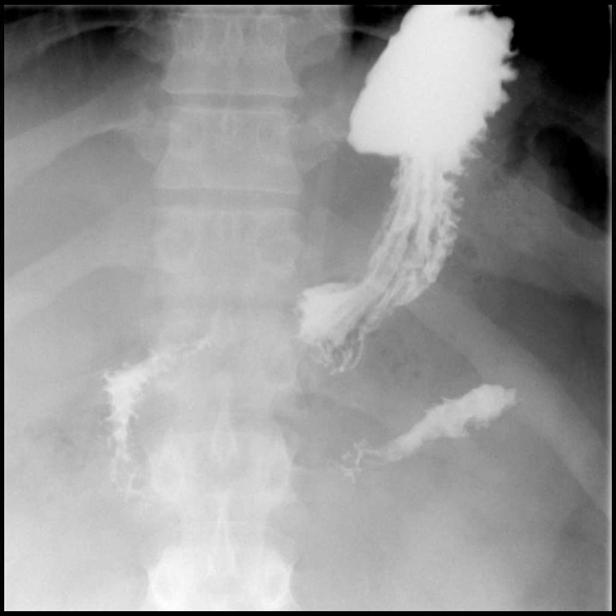

[fluoro_barium 2fps_bw (6 of 7)]
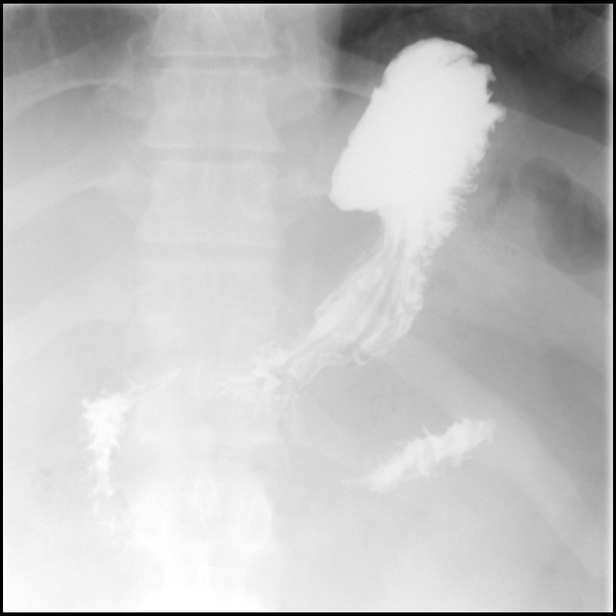

[Series 9: fluoro_barium 2fps_bw · 0.19mm/px · 2 of 2 frames shown (7 of 7)]
[frame 1/2]
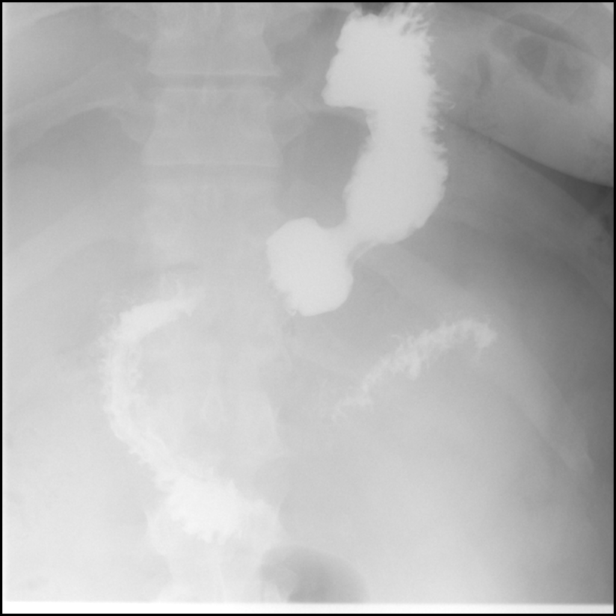
[frame 2/2]
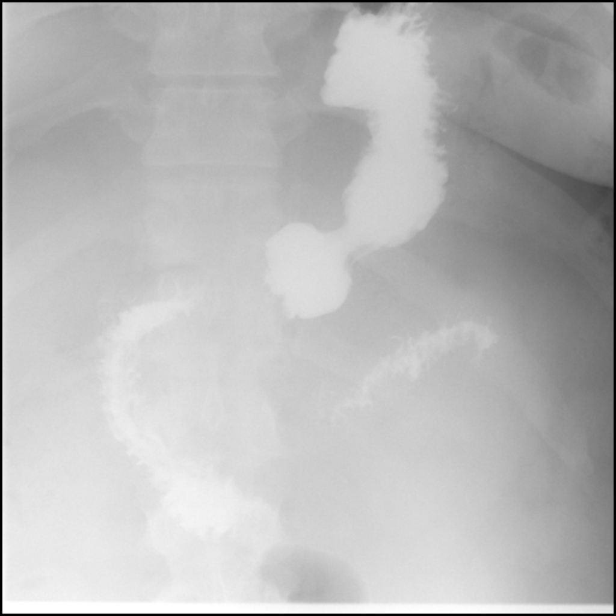

[12 of 12 positions shown; findings below may reference images not displayed]

FINDINGS: Initial KUB demonstrates no bowel obstruction.

Oral contrast flows normally through the esophagus and GE junction
into the stomach. No obstruction. No hiatal hernia. Stomach is
normal orientation. No stricture or mass. Normal C-loop of the
duodenum. The third portion the duodenum crosses the spine in normal
fashion with ligament Treitz in the LEFT upper quadrant.
IMPRESSION: Normal anatomy the esophagus, stomach, and duodenum. Ligament Treitz
in expected location.

## 2020-10-16 ENCOUNTER — Other Ambulatory Visit: Payer: Self-pay

## 2020-10-16 ENCOUNTER — Encounter: Payer: 59 | Attending: Surgery | Admitting: Skilled Nursing Facility1

## 2020-10-16 DIAGNOSIS — E669 Obesity, unspecified: Secondary | ICD-10-CM | POA: Insufficient documentation

## 2020-10-16 DIAGNOSIS — E119 Type 2 diabetes mellitus without complications: Secondary | ICD-10-CM | POA: Diagnosis not present

## 2020-10-16 NOTE — Progress Notes (Signed)
Follow-up visit:  Post-Operative sleeve Surgery  Primary concerns today: Post-operative Bariatric Surgery Nutrition Management   Anthropometrics  Surgery date: 02/27/2020 Surgery type: sleeve Start weight at [facility]: 387.7 Weight today: pt declined due to not feeling to good about things   Body Composition Scale 03/13/2020 04/24/2020 07/19/2020  Current Body Weight 347.1 321.7 301.1  Total Body Fat % 37.1 34.9 32.9  Visceral Fat 31 27 24   Fat-Free Mass % 62.8 65 67   Total Body Water % 43.8 46 48  Muscle-Mass lbs 65.5 60.7 56.7  BMI 42.8 39.7 37.1  Body Fat Displacement            Torso  lbs 80.2 69.8 61.5         Left Leg  lbs 16 13.9 12.3         Right Leg  lbs 16 13.9 12.3         Left Arm  lbs 8 6.9 6.1         Right Arm   lbs 8 6.9 6.1    Pt states he is feeling really good and is trying to remember that and not get too lost in the weight numbers. Pt states he has had a move which changes his routine and with fall here his work is relaxed and he is not as active. Pt states at work he is trying to find more meaningful ways to be active and in the week increasing work to do. Pt states since moving to his new home but realized there is a [employer] near him.  Pt states he does not eat fruit and is allergic to most fruits.   24 hr recall: Breakfast: decaf coffee + protein shake + oikos yogurt + 2 [place] bacon Snack: 100 calorie cashews  + protein shake Lunch: spaghetti sqash + chicken sausage + zucchini Snack: string cheese Dinner: spinach + mushroom + onion + [place] burger  Beverages: water, decaf coffee  Fluid intake: adequate   Medications: See List Supplementation: appropriate   CBG monitoring: once a week Average CBG per patient: under 100 Last patient reported A1c: 5.7  Using straws: no Drinking while eating: no Having you been chewing well: yes Chewing/swallowing difficulties: no Changes in vision: no Changes to mood/headaches: no Hair loss/Cahnges to  skin/Changes to nails: no Any difficulty focusing or concentrating: no Sweating: no Dizziness/Lightheaded: no Palpitations: no  Carbonated beverages: no N/V/D/C/GAS: no Abdominal Pain: no Dumping syndrome: no  Recent physical activity:  ADL's  Progress Towards Goal(s):  In Progress Teaching method utilized: Environmental health practitioner & Auditory  Demonstrated degree of understanding via: Teach Back  Readiness Level: Action Barriers to learning/adherence to lifestyle change: none identified  Goals: -increase activity   Teaching Method Utilized:  Visual Auditory Hands on  Demonstrated degree of understanding via:  Teach Back   Monitoring/Evaluation:  Dietary intake, exercise, and body weight. Follow up for one year

## 2020-12-31 ENCOUNTER — Encounter: Payer: Self-pay | Admitting: Internal Medicine

## 2021-01-01 ENCOUNTER — Other Ambulatory Visit: Payer: Self-pay

## 2021-01-01 MED ORDER — SPIRONOLACTONE 25 MG PO TABS: 25.0000 mg | ORAL_TABLET | Freq: Every evening | ORAL | 0 refills | Status: AC

## 2021-02-07 ENCOUNTER — Other Ambulatory Visit: Payer: Self-pay

## 2021-02-07 ENCOUNTER — Encounter: Payer: 59 | Attending: Surgery | Admitting: Skilled Nursing Facility1

## 2021-02-07 DIAGNOSIS — E669 Obesity, unspecified: Secondary | ICD-10-CM | POA: Diagnosis not present

## 2021-02-07 NOTE — Progress Notes (Signed)
Follow-up visit:  Post-Operative sleeve Surgery  Primary concerns today: Post-operative Bariatric Surgery Nutrition Management   Anthropometrics  Surgery date: 02/27/2020 Surgery type: sleeve Start weight at [facility]: 387.7 Weight today: pt declined due to not feeling to good about things; upset with himself for having gained 12 pounds   Body Composition Scale 03/13/2020 04/24/2020 07/19/2020  Current Body Weight 347.1 321.7 301.1  Total Body Fat % 37.1 34.9 32.9  Visceral Fat 31 27 24   Fat-Free Mass % 62.8 65 67   Total Body Water % 43.8 46 48  Muscle-Mass lbs 65.5 60.7 56.7  BMI 42.8 39.7 37.1  Body Fat Displacement            Torso  lbs 80.2 69.8 61.5         Left Leg  lbs 16 13.9 12.3         Right Leg  lbs 16 13.9 12.3         Left Arm  lbs 8 6.9 6.1         Right Arm   lbs 8 6.9 6.1    Pt states he gets a little reflux here and there perhaps linked to the food type or lay down on food.   Pt states over the holidays he gained 12 pounds. Pt states he does the cooking his wife does none.  Pt states he is allergic to a lot of fruits so he does not eat them.   Pt has a great variety of non starchy vegetables.  Pt states raw vegetable make him feel bloated.     24 hr recall: Breakfast: kashi cereal + [place] bacon Snack: 100 calorie cashews  + protein shake Lunch: zucchini lasagna + salad  Snack: string cheese Dinner: zucchini lasagna + salad   Beverages: water, decaf coffee  Fluid intake: adequate   Medications: See List Supplementation: appropriate    Using straws: no Drinking while eating: no Having you been chewing well: yes Chewing/swallowing difficulties: no Changes in vision: no Changes to mood/headaches: no Hair loss/Cahnges to skin/Changes to nails: no Any difficulty focusing or concentrating: no Sweating: no Dizziness/Lightheaded: no Palpitations: no  Carbonated beverages: no N/V/D/C/GAS: no Abdominal Pain: no Dumping syndrome: no  Recent  physical activity:  3-4 times a week lifting and cardio  Progress Towards Goal(s):  In Progress Teaching method utilized: Environmental health practitioner & Auditory  Demonstrated degree of understanding via: Teach Back  Readiness Level: Action Barriers to learning/adherence to lifestyle change: none identified  Goals: -add in more complex carbohydrates throughout the day and week -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 Follow up at the end of June

## 2021-07-02 ENCOUNTER — Ambulatory Visit: Payer: 59 | Admitting: Skilled Nursing Facility1

## 2021-09-20 ENCOUNTER — Encounter (HOSPITAL_COMMUNITY): Payer: Self-pay | Admitting: *Deleted

## 2023-06-19 ENCOUNTER — Other Ambulatory Visit (HOSPITAL_COMMUNITY): Payer: Self-pay | Admitting: Family Medicine

## 2023-06-19 DIAGNOSIS — I10 Essential (primary) hypertension: Secondary | ICD-10-CM

## 2023-06-22 ENCOUNTER — Ambulatory Visit (HOSPITAL_COMMUNITY)
Admission: RE | Admit: 2023-06-22 | Discharge: 2023-06-22 | Disposition: A | Discharge status: Home / Self Care | Source: Ambulatory Visit | Attending: Surgery | Admitting: Surgery

## 2023-06-22 DIAGNOSIS — I10 Essential (primary) hypertension: Secondary | ICD-10-CM | POA: Diagnosis present

## 2023-09-25 ENCOUNTER — Encounter (HOSPITAL_COMMUNITY): Payer: Self-pay | Admitting: *Deleted

## 2023-10-26 ENCOUNTER — Other Ambulatory Visit (HOSPITAL_BASED_OUTPATIENT_CLINIC_OR_DEPARTMENT_OTHER): Payer: Self-pay | Admitting: *Deleted

## 2023-10-26 DIAGNOSIS — I701 Atherosclerosis of renal artery: Secondary | ICD-10-CM

## 2023-12-09 ENCOUNTER — Encounter (HOSPITAL_BASED_OUTPATIENT_CLINIC_OR_DEPARTMENT_OTHER): Payer: Self-pay

## 2023-12-10 ENCOUNTER — Ambulatory Visit (HOSPITAL_BASED_OUTPATIENT_CLINIC_OR_DEPARTMENT_OTHER)

## 2023-12-10 DIAGNOSIS — I701 Atherosclerosis of renal artery: Secondary | ICD-10-CM
